# Patient Record
Sex: Female | Born: 1940 | Race: White | Hispanic: No | Smoking: Never smoker
Health system: Southern US, Community
[De-identification: ages and names within clinical notes are randomized; demographics above are authoritative.]

## PROBLEM LIST (undated history)

## (undated) DIAGNOSIS — E7800 Pure hypercholesterolemia, unspecified: Secondary | ICD-10-CM

## (undated) DIAGNOSIS — Z9221 Personal history of antineoplastic chemotherapy: Secondary | ICD-10-CM

## (undated) DIAGNOSIS — C50919 Malignant neoplasm of unspecified site of unspecified female breast: Secondary | ICD-10-CM

## (undated) DIAGNOSIS — M8580 Other specified disorders of bone density and structure, unspecified site: Secondary | ICD-10-CM

## (undated) DIAGNOSIS — Z923 Personal history of irradiation: Secondary | ICD-10-CM

## (undated) HISTORY — DX: Malignant neoplasm of unspecified site of unspecified female breast: C50.919

## (undated) HISTORY — DX: Pure hypercholesterolemia, unspecified: E78.00

## (undated) HISTORY — PX: EYE SURGERY: SHX253

## (undated) HISTORY — DX: Other specified disorders of bone density and structure, unspecified site: M85.80

---

## 2004-11-26 ENCOUNTER — Ambulatory Visit: Payer: Self-pay | Admitting: Internal Medicine

## 2005-06-24 DIAGNOSIS — C50919 Malignant neoplasm of unspecified site of unspecified female breast: Secondary | ICD-10-CM

## 2005-06-24 HISTORY — DX: Malignant neoplasm of unspecified site of unspecified female breast: C50.919

## 2005-06-24 HISTORY — PX: BREAST LUMPECTOMY: SHX2

## 2005-12-31 ENCOUNTER — Ambulatory Visit: Payer: Self-pay | Admitting: Internal Medicine

## 2012-03-16 ENCOUNTER — Telehealth: Payer: Self-pay | Admitting: Internal Medicine

## 2012-03-16 NOTE — Telephone Encounter (Signed)
Error

## 2012-04-30 ENCOUNTER — Encounter: Payer: Self-pay | Admitting: *Deleted

## 2012-05-01 ENCOUNTER — Encounter: Payer: Self-pay | Admitting: Internal Medicine

## 2012-05-01 ENCOUNTER — Encounter: Payer: Self-pay | Admitting: *Deleted

## 2012-05-01 ENCOUNTER — Ambulatory Visit (INDEPENDENT_AMBULATORY_CARE_PROVIDER_SITE_OTHER): Payer: Medicare Other | Admitting: Internal Medicine

## 2012-05-01 VITALS — BP 169/85 | HR 77 | Temp 98.3°F | Ht 64.5 in | Wt 141.0 lb

## 2012-05-01 DIAGNOSIS — M899 Disorder of bone, unspecified: Secondary | ICD-10-CM

## 2012-05-01 DIAGNOSIS — M8580 Other specified disorders of bone density and structure, unspecified site: Secondary | ICD-10-CM

## 2012-05-01 DIAGNOSIS — E7800 Pure hypercholesterolemia, unspecified: Secondary | ICD-10-CM

## 2012-05-01 DIAGNOSIS — C50919 Malignant neoplasm of unspecified site of unspecified female breast: Secondary | ICD-10-CM

## 2012-05-01 NOTE — Patient Instructions (Signed)
It was nice seeing you today.  Let me know if you need anything.   

## 2012-05-03 ENCOUNTER — Encounter: Payer: Self-pay | Admitting: Internal Medicine

## 2012-05-03 DIAGNOSIS — Z853 Personal history of malignant neoplasm of breast: Secondary | ICD-10-CM | POA: Insufficient documentation

## 2012-05-03 DIAGNOSIS — M8580 Other specified disorders of bone density and structure, unspecified site: Secondary | ICD-10-CM | POA: Insufficient documentation

## 2012-05-03 DIAGNOSIS — E7800 Pure hypercholesterolemia, unspecified: Secondary | ICD-10-CM | POA: Insufficient documentation

## 2012-05-03 NOTE — Assessment & Plan Note (Signed)
Low cholesterol diet.  Follow.  Check lipid panel with next fasting labs.

## 2012-05-03 NOTE — Assessment & Plan Note (Signed)
Followed by Dr Georga Hacking [place].  Mammogram 04/27/12 - Benign/normal.

## 2012-05-03 NOTE — Progress Notes (Signed)
  Subjective:    Patient ID: Renee Vazquez, female    DOB: [birth year], 71 y.o.   MRN: [id number]  HPI 71 year old female with past history of breast cancer s/p lumpectomy and hypercholesterolemia who comes in today for a scheduled follow up.  She has been under increased stress recently.  Her mother passed away suddenly - in 12/19/22.  States some days are better than others.  States she just takes one day at a time.  Discussed further intervention - including counseling, etc.  She does not feel she needs anything more at this point.  Will notify me if she does.  Has good family support.  Physically - she thinks she is doing well.  Eating and drinking well.  Sleeping better.    Past Medical History  Diagnosis Date  . Hypercholesterolemia   . Breast cancer     Dr Jerelene Redden  . Osteopenia     Review of Systems Patient denies any headache, lightheadedness or dizziness.  No sinus or allergy symptoms.  No chest pain, tightness or palpitations.  No increased shortness of breath, cough or congestion.  No nausea or vomiting.  No acid reflux.   No abdominal pain or cramping.  No bowel change, such as diarrhea, constipation, BRBPR or melana.  No urine change.  Increased stress as outlined.       Objective:   Physical Exam Filed Vitals:   05/01/12 1328  BP: 169/85  Pulse: 77  Temp: 98.3 F (36.8 C)   Blood pressure recheck:  76/43  71 year old female in no acute distress.   HEENT:  Nares - clear.  OP- without lesions or erythema.  NECK:  Supple, nontender.  No audible bruit.   HEART:  Appears to be regular. LUNGS:  Without crackles or wheezing audible.  Respirations even and unlabored.   RADIAL PULSE:  Equal bilaterally.  ABDOMEN:  Soft, nontender.  No audible abdominal bruit.   EXTREMITIES:  No increased edema to be present.                     Assessment & Plan:  INCREASED PSYCHOSOCIAL STRESSORS.  Trying to cope with her mother's recent death.  Increased stress related to  this.  See above.  Will notify me if she feel she needs anything more.  Has good family support.  Spent more than 25 minutes with her and more than 50% of time spent in counseling.   ELEVATED BLOOD PRESSURE.  Blood pressure as outlined.  Feel this is related to the increased stress.  Have her spot check her pressure and send in readings over the next few weeks.  If persistent elevation, will require medication.  Check metabolic panel.    HEALTH MAINTENANCE.  Physical 08/05/11.  She declines GU and rectal exam.  Declines further GI screening including colonoscopy and hemoccult cards.  Mammograms are followed through [facility].  Last mammogram 04/27/12 - Normal/Benign.  Recommended follow up mammogram in one year.

## 2012-05-03 NOTE — Assessment & Plan Note (Signed)
Has been on Fosamax now for 5 years.  Will stop.  Needs a follow up bone density.  Continue calcium and vitamin D.

## 2012-07-24 ENCOUNTER — Other Ambulatory Visit (INDEPENDENT_AMBULATORY_CARE_PROVIDER_SITE_OTHER): Payer: Medicare PPO

## 2012-07-24 ENCOUNTER — Telehealth: Payer: Self-pay | Admitting: Internal Medicine

## 2012-07-24 ENCOUNTER — Telehealth: Payer: Self-pay | Admitting: *Deleted

## 2012-07-24 DIAGNOSIS — R5383 Other fatigue: Secondary | ICD-10-CM

## 2012-07-24 DIAGNOSIS — E7800 Pure hypercholesterolemia, unspecified: Secondary | ICD-10-CM

## 2012-07-24 DIAGNOSIS — R5381 Other malaise: Secondary | ICD-10-CM

## 2012-07-24 DIAGNOSIS — M8580 Other specified disorders of bone density and structure, unspecified site: Secondary | ICD-10-CM

## 2012-07-24 DIAGNOSIS — C50919 Malignant neoplasm of unspecified site of unspecified female breast: Secondary | ICD-10-CM

## 2012-07-24 LAB — COMPREHENSIVE METABOLIC PANEL
ALT: 17 U/L (ref 0–35)
AST: 20 U/L (ref 0–37)
Alkaline Phosphatase: 56 U/L (ref 39–117)
Creatinine, Ser: 1 mg/dL (ref 0.4–1.2)
GFR: 60.92 mL/min (ref >60.00)
Total Bilirubin: 0.8 mg/dL (ref 0.3–1.2)

## 2012-07-24 LAB — CBC WITH DIFFERENTIAL/PLATELET
Basophils Absolute: 0.1 10*3/uL (ref 0.0–0.1)
Eosinophils Absolute: 0.2 10*3/uL (ref 0.0–0.7)
HCT: 40.2 % (ref 36.0–46.0)
Hemoglobin: 13.7 g/dL (ref 12.0–15.0)
Lymphs Abs: 2.1 10*3/uL (ref 0.7–4.0)
MCHC: 34 g/dL (ref 30.0–36.0)
Monocytes Absolute: 0.6 10*3/uL (ref 0.1–1.0)
Neutro Abs: 3.1 10*3/uL (ref 1.4–7.7)
RDW: 14 % (ref 11.5–14.6)

## 2012-07-24 LAB — LIPID PANEL
HDL: 56.8 mg/dL (ref >39.00)
Total CHOL/HDL Ratio: 4
Triglycerides: 167 mg/dL — ABNORMAL HIGH (ref 0.0–149.0)

## 2012-07-24 NOTE — Telephone Encounter (Signed)
What labs and dx would you like for this pt?  

## 2012-07-24 NOTE — Telephone Encounter (Signed)
i placed order for labs

## 2012-07-24 NOTE — Telephone Encounter (Signed)
Order placed for labs.

## 2012-07-25 LAB — VITAMIN D 25 HYDROXY (VIT D DEFICIENCY, FRACTURES): Vit D, 25-Hydroxy: 55 ng/mL (ref 30–89)

## 2012-07-31 ENCOUNTER — Encounter: Payer: Self-pay | Admitting: Internal Medicine

## 2012-08-11 ENCOUNTER — Ambulatory Visit (INDEPENDENT_AMBULATORY_CARE_PROVIDER_SITE_OTHER): Payer: Medicare PPO | Admitting: Internal Medicine

## 2012-08-11 ENCOUNTER — Encounter: Payer: Self-pay | Admitting: Internal Medicine

## 2012-08-11 VITALS — BP 120/74 | HR 82 | Temp 98.8°F | Resp 16 | Ht 62.0 in | Wt 143.0 lb

## 2012-08-11 DIAGNOSIS — M8580 Other specified disorders of bone density and structure, unspecified site: Secondary | ICD-10-CM

## 2012-08-11 DIAGNOSIS — E7800 Pure hypercholesterolemia, unspecified: Secondary | ICD-10-CM

## 2012-08-11 DIAGNOSIS — M899 Disorder of bone, unspecified: Secondary | ICD-10-CM

## 2012-08-11 DIAGNOSIS — C50919 Malignant neoplasm of unspecified site of unspecified female breast: Secondary | ICD-10-CM

## 2012-08-11 DIAGNOSIS — M949 Disorder of cartilage, unspecified: Secondary | ICD-10-CM

## 2012-08-15 ENCOUNTER — Encounter: Payer: Self-pay | Admitting: Internal Medicine

## 2012-08-15 NOTE — Assessment & Plan Note (Signed)
Continue calcium and vitamin D and weightbearing exercise 

## 2012-08-15 NOTE — Progress Notes (Signed)
  Subjective:    Patient ID: Renee Vazquez, female    DOB: 12/17/1940, 72 y.o.   MRN: [id number]  HPI 72 year old female with past history of breast cancer s/p lumpectomy and hypercholesterolemia who comes in today to follow up on these issues as well as for her complete physical exam.  She has been under increased stress recently.  Her mother passed away suddenly - in 12/19/2022.  She is doing better now.  Feels better.  Getting out more.  Exercising.  Line dancing.   No chest pain or tightness. Breathing stable.  Bowels stable.    Past Medical History  Diagnosis Date  . Hypercholesterolemia   . Breast cancer     Dr Jerelene Redden  . Osteopenia     Review of Systems Patient denies any headache, lightheadedness or dizziness.  No sinus or allergy symptoms.  No chest pain, tightness or palpitations.  No increased shortness of breath, cough or congestion.  No nausea or vomiting.  No acid reflux.   No abdominal pain or cramping.  No bowel change, such as diarrhea, constipation, BRBPR or melana.  No urine change.  Handling stress better.  Doing well.       Objective:   Physical Exam  Filed Vitals:   08/11/12 0831  BP: 120/74  Pulse: 82  Temp: 98.8 F (37.1 C)  Resp: 16   Blood pressure recheck:  20/28  72 year old female in no acute distress.   HEENT:  Nares- clear.  Oropharynx - without lesions. NECK:  Supple.  Nontender.  No audible bruit.  HEART:  Appears to be regular. LUNGS:  No crackles or wheezing audible.  Respirations even and unlabored.  RADIAL PULSE:  Equal bilaterally.    BREASTS:  No nipple discharge or nipple retraction present.  Could not appreciate any distinct nodules or axillary adenopathy.  ABDOMEN:  Soft, nontender.  Bowel sounds present and normal.  No audible abdominal bruit.  GU:  Pt declined.    EXTREMITIES:  No increased edema present.  DP pulses palpable and equal bilaterally.             Assessment & Plan:  INCREASED PSYCHOSOCIAL STRESSORS.  Doing  better.  Getting out more.  Exercising.  Follow.    ELEVATED BLOOD PRESSURE.  Was elevated last visit.  Better now.  Follow.    HEALTH MAINTENANCE.  Physical today.  She declines GU and rectal exam.  Declines further GI screening including colonoscopy and hemoccult cards.  Mammograms were followed through [facility].  Last mammogram 04/27/12 - Normal/Benign.  Recommended follow up mammogram in one year.

## 2012-08-15 NOTE — Assessment & Plan Note (Signed)
Doing well.  Has been released by [facility].  Follow.  Up to date with mammograms.

## 2012-08-15 NOTE — Assessment & Plan Note (Signed)
Low cholesterol diet and exercise.  Prefers not to take medication.  Follow lipid panel.      

## 2013-02-12 ENCOUNTER — Other Ambulatory Visit (INDEPENDENT_AMBULATORY_CARE_PROVIDER_SITE_OTHER): Payer: Medicare PPO

## 2013-02-12 DIAGNOSIS — E7800 Pure hypercholesterolemia, unspecified: Secondary | ICD-10-CM

## 2013-02-12 LAB — LIPID PANEL
HDL: 60.6 mg/dL (ref >39.00)
Total CHOL/HDL Ratio: 4

## 2013-02-12 LAB — LDL CHOLESTEROL, DIRECT: Direct LDL: 146.9 mg/dL

## 2013-02-19 ENCOUNTER — Ambulatory Visit (INDEPENDENT_AMBULATORY_CARE_PROVIDER_SITE_OTHER): Payer: Medicare PPO | Admitting: Internal Medicine

## 2013-02-19 ENCOUNTER — Encounter: Payer: Self-pay | Admitting: Internal Medicine

## 2013-02-19 VITALS — BP 122/70 | HR 76 | Temp 98.0°F | Ht 62.0 in | Wt 140.8 lb

## 2013-02-19 DIAGNOSIS — Z1239 Encounter for other screening for malignant neoplasm of breast: Secondary | ICD-10-CM

## 2013-02-19 DIAGNOSIS — C50919 Malignant neoplasm of unspecified site of unspecified female breast: Secondary | ICD-10-CM

## 2013-02-19 DIAGNOSIS — M8580 Other specified disorders of bone density and structure, unspecified site: Secondary | ICD-10-CM

## 2013-02-19 DIAGNOSIS — M899 Disorder of bone, unspecified: Secondary | ICD-10-CM

## 2013-02-19 DIAGNOSIS — E7800 Pure hypercholesterolemia, unspecified: Secondary | ICD-10-CM

## 2013-02-19 NOTE — Progress Notes (Signed)
  Subjective:    Patient ID: Renee Vazquez, female    DOB: Feb 27, 1941, 72 y.o.   MRN: [id number]  HPI 72 year old female with past history of breast cancer s/p lumpectomy and hypercholesterolemia who comes in today for a scheduled follow up.  She has been under increased stress recently.  Her mother passed away suddenly - 12/26/11.  She is doing better now.  Feels better.  Getting out more.  Exercising.  Line dancing.   No chest pain or tightness. Breathing stable.  Bowels stable.  Coping relatively well.  Physically doing well.     Past Medical History  Diagnosis Date  . Hypercholesterolemia   . Breast cancer     Dr Jerelene Redden  . Osteopenia     Current Outpatient Prescriptions on File Prior to Visit  Medication Sig Dispense Refill  . calcium citrate-vitamin D 200-200 MG-UNIT TABS Take 1 tablet by mouth daily.      . Multiple Vitamins-Minerals (CENTRUM SILVER ADULT 50+ PO) Take by mouth.       No current facility-administered medications on file prior to visit.    Review of Systems Patient denies any headache, lightheadedness or dizziness.  No sinus or allergy symptoms.  No chest pain, tightness or palpitations.  No increased shortness of breath, cough or congestion.  No nausea or vomiting.  No acid reflux.   No abdominal pain or cramping.  No bowel change, such as diarrhea, constipation, BRBPR or melana.  No urine change.  Handling stress better.  Overall appears to be doing well.       Objective:   Physical Exam  Filed Vitals:   02/19/13 0824  BP: 122/70  Pulse: 76  Temp: 98 F (36.7 C)   Blood pressure recheck:  122/70, pulse 78  72 year old female in no acute distress.   HEENT:  Nares- clear.  Oropharynx - without lesions. NECK:  Supple.  Nontender.  No audible bruit.  HEART:  Appears to be regular. LUNGS:  No crackles or wheezing audible.  Respirations even and unlabored.  RADIAL PULSE:  Equal bilaterally.   ABDOMEN:  Soft, nontender.  Bowel sounds  present and normal.  No audible abdominal bruit.   EXTREMITIES:  No increased edema present.  DP pulses palpable and equal bilaterally.             Assessment & Plan:  INCREASED PSYCHOSOCIAL STRESSORS.  Doing better.  Getting out more.  Exercising.  Follow.    ELEVATED BLOOD PRESSURE.  Doing well now.  Follow.    HEALTH MAINTENANCE.  Physical 08/11/12.  She declines GU and rectal exam.  Declines further GI screening including colonoscopy and hemoccult cards.  Mammograms were followed through [facility].  Last mammogram 04/27/12 - Normal/Benign.  Recommended follow up mammogram in one year.  will schedule.

## 2013-02-22 ENCOUNTER — Encounter: Payer: Self-pay | Admitting: Internal Medicine

## 2013-02-22 NOTE — Assessment & Plan Note (Signed)
Continue calcium and vitamin D and weightbearing exercise 

## 2013-02-22 NOTE — Assessment & Plan Note (Signed)
Low cholesterol diet and exercise.  Prefers not to take medication.  Follow lipid panel.  Cholesterol just checked 02/12/13 - revealed total cholesterol 223, triglycerides 106, HDL 60 and LDL 147.

## 2013-02-22 NOTE — Assessment & Plan Note (Signed)
Doing well.  Has been released by [facility].  Follow.  Up to date with mammograms.  Due in November.  Schedule.

## 2013-05-09 ENCOUNTER — Emergency Department: Payer: Self-pay | Admitting: Emergency Medicine

## 2013-05-10 ENCOUNTER — Encounter: Payer: Self-pay | Admitting: Internal Medicine

## 2013-05-13 ENCOUNTER — Ambulatory Visit (INDEPENDENT_AMBULATORY_CARE_PROVIDER_SITE_OTHER): Payer: Medicare PPO | Admitting: Adult Health

## 2013-05-13 ENCOUNTER — Encounter: Payer: Self-pay | Admitting: Adult Health

## 2013-05-13 VITALS — BP 136/80 | HR 100 | Temp 98.2°F | Resp 14 | Wt 142.0 lb

## 2013-05-13 DIAGNOSIS — S81802A Unspecified open wound, left lower leg, initial encounter: Secondary | ICD-10-CM | POA: Insufficient documentation

## 2013-05-13 DIAGNOSIS — IMO0002 Reserved for concepts with insufficient information to code with codable children: Secondary | ICD-10-CM

## 2013-05-13 DIAGNOSIS — S81802S Unspecified open wound, left lower leg, sequela: Secondary | ICD-10-CM

## 2013-05-13 NOTE — Progress Notes (Signed)
  Subjective:    Patient ID: Renee Vazquez, female    DOB: Feb 25, 1941, 72 y.o.   MRN: [id number]  HPI  Patient presents to clinic for f/u visit to [facility] ED following a left leg wound. She reports hitting her leg on the corner of the dishwasher. She was instructed not to get the area wet. She has steri strips in place. Denies fever, pain, wound drainage. She has been changing the dressing daily - applies a non-stick dressing and then covers with gauze.  Current Outpatient Prescriptions on File Prior to Visit  Medication Sig Dispense Refill  . calcium citrate-vitamin D 200-200 MG-UNIT TABS Take 1 tablet by mouth daily.      . Multiple Vitamins-Minerals (CENTRUM SILVER ADULT 50+ PO) Take by mouth.       No current facility-administered medications on file prior to visit.     Review of Systems  Constitutional: Negative for fever and chills.  Skin: Positive for wound.       Left leg wound. ED visit on Sunday following injury with dishwasher.       Objective:   Physical Exam  Constitutional: She is oriented to person, place, and time. No distress.  Neurological: She is alert and oriented to person, place, and time.  Skin: Skin is warm and dry. No erythema.  Left anterior leg with steri strips to wound. Skin is well approximated. No swelling, erythema or drainage from site noted.  Psychiatric: She has a normal mood and affect. Her behavior is normal. Judgment and thought content normal.          Assessment & Plan:

## 2013-05-13 NOTE — Assessment & Plan Note (Signed)
Follow up visit. No s/s infection. Skin well approximated. Steri strips in place. Advised that steri strips will fall off on their own. Report any redness, swelling or drainage from wound. Return to clinic for follow up next Wednesday.

## 2013-05-13 NOTE — Progress Notes (Signed)
Pre visit review using our clinic review tool, if applicable. No additional management support is needed unless otherwise documented below in the visit note. 

## 2013-05-19 ENCOUNTER — Encounter: Payer: Self-pay | Admitting: Adult Health

## 2013-05-19 ENCOUNTER — Ambulatory Visit (INDEPENDENT_AMBULATORY_CARE_PROVIDER_SITE_OTHER): Payer: Medicare PPO | Admitting: Adult Health

## 2013-05-19 VITALS — BP 134/80 | HR 90 | Temp 97.8°F | Wt 142.0 lb

## 2013-05-19 DIAGNOSIS — S81802D Unspecified open wound, left lower leg, subsequent encounter: Secondary | ICD-10-CM

## 2013-05-19 DIAGNOSIS — S81009A Unspecified open wound, unspecified knee, initial encounter: Secondary | ICD-10-CM

## 2013-05-19 NOTE — Progress Notes (Signed)
Pre visit review using our clinic review tool, if applicable. No additional management support is needed unless otherwise documented below in the visit note. 

## 2013-05-19 NOTE — Progress Notes (Signed)
  Subjective:    Patient ID: Renee Vazquez, female    DOB: [birth year], 72 y.o.   MRN: [id number]  HPI  Patient is a pleasant 72 y/o female who was seen in clinic on 05/13/13 following ED visit for leg wound. She injured her leg on the corner of the dishwasher. Steri strips were in place rather than sutures 2/2 jagged wound. She did not show any s/s of infection of wound. Skin was well approximated. The area showed dried blood from the incident. She was asked to return today for f/u evaluation of wound.  Patient reports no problems. No signs and symptoms of infection. She denies any drainage from the wound. When she walks, she does not feel the skin pulling. She has not yet wet the area. Is experiencing some itching around the wound. Reports using Benadryl cream to the area that was itching. It was not applied directly on the wound.  Current Outpatient Prescriptions on File Prior to Visit  Medication Sig Dispense Refill  . calcium citrate-vitamin D 200-200 MG-UNIT TABS Take 1 tablet by mouth daily.      . Multiple Vitamins-Minerals (CENTRUM SILVER ADULT 50+ PO) Take by mouth.       No current facility-administered medications on file prior to visit.      Review of Systems  Cardiovascular: Positive for leg swelling.  Skin: Positive for wound.       Itching below the wound. Pt has applied benadryl.  Psychiatric/Behavioral: Negative.        Objective:   Physical Exam  Musculoskeletal: She exhibits edema.  Trace edema to LLE  Skin:  Wound to LLE, anteriorly. Steri strips remain in place. No s/s infection. No erythema, drainage. Skin is well approximated.          Assessment & Plan:

## 2013-05-19 NOTE — Assessment & Plan Note (Signed)
Wound healing nicely. No s/s infection. Pt has slight edema of LLE. She reports normally wearing hose with slight compression. Has not worn them since injured. May shower since wound is well approximated. Advised that steri strips will fall off on their own once she begins to allow soap and water on the area. Instructed to protect area when she is out shopping. She normally will wrap the leg if going out for extended periods. Encouraged her to apply her compression hose for the slight edema she is having. RTC if any redness, drainage from wound, pain at the site or any other concerns regarding her wound appearance.

## 2013-08-16 ENCOUNTER — Other Ambulatory Visit (INDEPENDENT_AMBULATORY_CARE_PROVIDER_SITE_OTHER): Payer: Medicare PPO

## 2013-08-16 DIAGNOSIS — E7800 Pure hypercholesterolemia, unspecified: Secondary | ICD-10-CM

## 2013-08-16 DIAGNOSIS — M949 Disorder of cartilage, unspecified: Secondary | ICD-10-CM

## 2013-08-16 DIAGNOSIS — C50919 Malignant neoplasm of unspecified site of unspecified female breast: Secondary | ICD-10-CM

## 2013-08-16 DIAGNOSIS — M8580 Other specified disorders of bone density and structure, unspecified site: Secondary | ICD-10-CM

## 2013-08-16 DIAGNOSIS — M899 Disorder of bone, unspecified: Secondary | ICD-10-CM

## 2013-08-16 LAB — COMPREHENSIVE METABOLIC PANEL
ALBUMIN: 3.7 g/dL (ref 3.5–5.2)
ALT: 17 U/L (ref 0–35)
AST: 24 U/L (ref 0–37)
Alkaline Phosphatase: 61 U/L (ref 39–117)
BILIRUBIN TOTAL: 0.7 mg/dL (ref 0.3–1.2)
BUN: 13 mg/dL (ref 6–23)
CALCIUM: 9.4 mg/dL (ref 8.4–10.5)
CHLORIDE: 106 meq/L (ref 96–112)
CO2: 27 meq/L (ref 19–32)
Creatinine, Ser: 0.8 mg/dL (ref 0.4–1.2)
GFR: 70.86 mL/min (ref >60.00)
GLUCOSE: 96 mg/dL (ref 70–99)
POTASSIUM: 4.3 meq/L (ref 3.5–5.1)
SODIUM: 140 meq/L (ref 135–145)
TOTAL PROTEIN: 7.4 g/dL (ref 6.0–8.3)

## 2013-08-16 LAB — CBC WITH DIFFERENTIAL/PLATELET
Basophils Absolute: 0.1 10*3/uL (ref 0.0–0.1)
Basophils Relative: 1.1 % (ref 0.0–3.0)
EOS PCT: 2.3 % (ref 0.0–5.0)
Eosinophils Absolute: 0.1 10*3/uL (ref 0.0–0.7)
HEMATOCRIT: 32.1 % — AB (ref 36.0–46.0)
Hemoglobin: 10 g/dL — ABNORMAL LOW (ref 12.0–15.0)
LYMPHS ABS: 1.7 10*3/uL (ref 0.7–4.0)
LYMPHS PCT: 28.5 % (ref 12.0–46.0)
MCHC: 31 g/dL (ref 30.0–36.0)
MCV: 75.3 fl — ABNORMAL LOW (ref 78.0–100.0)
MONOS PCT: 8.8 % (ref 3.0–12.0)
Monocytes Absolute: 0.5 10*3/uL (ref 0.1–1.0)
Neutro Abs: 3.5 10*3/uL (ref 1.4–7.7)
Neutrophils Relative %: 59.3 % (ref 43.0–77.0)
PLATELETS: 368 10*3/uL (ref 150.0–400.0)
RBC: 4.26 Mil/uL (ref 3.87–5.11)
RDW: 18.3 % — ABNORMAL HIGH (ref 11.5–14.6)
WBC: 5.9 10*3/uL (ref 4.5–10.5)

## 2013-08-16 LAB — LIPID PANEL
Cholesterol: 241 mg/dL — ABNORMAL HIGH (ref 0–200)
HDL: 63.9 mg/dL (ref >39.00)
Total CHOL/HDL Ratio: 4
Triglycerides: 140 mg/dL (ref 0.0–149.0)
VLDL: 28 mg/dL (ref 0.0–40.0)

## 2013-08-16 LAB — TSH: TSH: 5.96 u[IU]/mL — AB (ref 0.35–5.50)

## 2013-08-16 LAB — LDL CHOLESTEROL, DIRECT: LDL DIRECT: 152.4 mg/dL

## 2013-08-17 LAB — VITAMIN D 25 HYDROXY (VIT D DEFICIENCY, FRACTURES): VIT D 25 HYDROXY: 61 ng/mL (ref 30–89)

## 2013-08-18 ENCOUNTER — Other Ambulatory Visit (INDEPENDENT_AMBULATORY_CARE_PROVIDER_SITE_OTHER): Payer: Medicare PPO

## 2013-08-18 ENCOUNTER — Telehealth: Payer: Self-pay | Admitting: *Deleted

## 2013-08-18 DIAGNOSIS — D649 Anemia, unspecified: Secondary | ICD-10-CM

## 2013-08-18 LAB — IBC PANEL
IRON: 81 ug/dL (ref 42–145)
SATURATION RATIOS: 15.8 % — AB (ref 20.0–50.0)
Transferrin: 366.7 mg/dL — ABNORMAL HIGH (ref 212.0–360.0)

## 2013-08-18 LAB — CBC WITH DIFFERENTIAL/PLATELET
Basophils Absolute: 0.1 10*3/uL (ref 0.0–0.1)
Basophils Relative: 1.1 % (ref 0.0–3.0)
EOS PCT: 2.8 % (ref 0.0–5.0)
Eosinophils Absolute: 0.2 10*3/uL (ref 0.0–0.7)
HEMATOCRIT: 30.6 % — AB (ref 36.0–46.0)
Hemoglobin: 9.5 g/dL — ABNORMAL LOW (ref 12.0–15.0)
Lymphocytes Relative: 37.4 % (ref 12.0–46.0)
Lymphs Abs: 2.1 10*3/uL (ref 0.7–4.0)
MCHC: 31.1 g/dL (ref 30.0–36.0)
MCV: 75.1 fl — ABNORMAL LOW (ref 78.0–100.0)
Monocytes Absolute: 0.6 10*3/uL (ref 0.1–1.0)
Monocytes Relative: 10.3 % (ref 3.0–12.0)
NEUTROS PCT: 48.4 % (ref 43.0–77.0)
Neutro Abs: 2.8 10*3/uL (ref 1.4–7.7)
PLATELETS: 347 10*3/uL (ref 150.0–400.0)
RBC: 4.08 Mil/uL (ref 3.87–5.11)
RDW: 18.6 % — ABNORMAL HIGH (ref 11.5–14.6)
WBC: 5.7 10*3/uL (ref 4.5–10.5)

## 2013-08-18 LAB — VITAMIN B12: VITAMIN B 12: 549 pg/mL (ref 211–911)

## 2013-08-18 LAB — FERRITIN: FERRITIN: 6.3 ng/mL — AB (ref 10.0–291.0)

## 2013-08-18 NOTE — Telephone Encounter (Signed)
What labs and dx?  

## 2013-08-18 NOTE — Telephone Encounter (Signed)
Orders placed for labs

## 2013-08-20 ENCOUNTER — Encounter: Payer: Self-pay | Admitting: Internal Medicine

## 2013-08-20 ENCOUNTER — Ambulatory Visit (INDEPENDENT_AMBULATORY_CARE_PROVIDER_SITE_OTHER): Payer: Medicare PPO | Admitting: Internal Medicine

## 2013-08-20 VITALS — BP 140/80 | HR 81 | Temp 98.1°F | Ht 62.0 in | Wt 145.5 lb

## 2013-08-20 DIAGNOSIS — C50919 Malignant neoplasm of unspecified site of unspecified female breast: Secondary | ICD-10-CM

## 2013-08-20 DIAGNOSIS — M949 Disorder of cartilage, unspecified: Secondary | ICD-10-CM

## 2013-08-20 DIAGNOSIS — E7800 Pure hypercholesterolemia, unspecified: Secondary | ICD-10-CM

## 2013-08-20 DIAGNOSIS — S81009A Unspecified open wound, unspecified knee, initial encounter: Secondary | ICD-10-CM

## 2013-08-20 DIAGNOSIS — M8580 Other specified disorders of bone density and structure, unspecified site: Secondary | ICD-10-CM

## 2013-08-20 DIAGNOSIS — S91009A Unspecified open wound, unspecified ankle, initial encounter: Secondary | ICD-10-CM

## 2013-08-20 DIAGNOSIS — S81809A Unspecified open wound, unspecified lower leg, initial encounter: Secondary | ICD-10-CM

## 2013-08-20 DIAGNOSIS — D509 Iron deficiency anemia, unspecified: Secondary | ICD-10-CM

## 2013-08-20 DIAGNOSIS — D649 Anemia, unspecified: Secondary | ICD-10-CM

## 2013-08-20 DIAGNOSIS — M899 Disorder of bone, unspecified: Secondary | ICD-10-CM

## 2013-08-20 DIAGNOSIS — S81802A Unspecified open wound, left lower leg, initial encounter: Secondary | ICD-10-CM

## 2013-08-20 NOTE — Progress Notes (Signed)
  Subjective:    Patient ID: Renee Vazquez, female    DOB: 1941-03-12, 73 y.o.   MRN: [id number]  HPI 73 year old female with past history of breast cancer s/p lumpectomy and hypercholesterolemia who comes in today to follow up on these issues as well as for a complete physical exam.   Getting out more.  Exercising.  Line dancing.   No chest pain or tightness.  Breathing stable.  Bowels stable.  Physically doing well.  Has noticed some sneezing.  Uses simply saline occasionally.  No cough or chest congestion.  Recent labs revealed a decrease in hgb.  She denies any bleeding.  Discussed the need for colon evaluation.     Past Medical History  Diagnosis Date  . Hypercholesterolemia   . Breast cancer     Dr Ewell Poe  . Osteopenia     Current Outpatient Prescriptions on File Prior to Visit  Medication Sig Dispense Refill  . calcium citrate-vitamin D 200-200 MG-UNIT TABS Take 1 tablet by mouth daily.      . Multiple Vitamins-Minerals (CENTRUM SILVER ADULT 50+ PO) Take by mouth.       No current facility-administered medications on file prior to visit.    Review of Systems Patient denies any headache, lightheadedness or dizziness.  Some occasional sneezing and minimal nasal congestion.   No chest pain, tightness or palpitations.  No increased shortness of breath, cough or congestion.  No chest congestion.  No nausea or vomiting.  No acid reflux.   No abdominal pain or cramping.  No bowel change, such as diarrhea, constipation, BRBPR or melana.  No urine change.  Overall appears to be doing well.       Objective:   Physical Exam  Filed Vitals:   08/20/13 1024  BP: 140/80  Pulse: 81  Temp: 98.1 F (36.7 C)   Blood pressure recheck:  60/13  73 year old female in no acute distress.   HEENT:  Nares- clear.  Oropharynx - without lesions. NECK:  Supple.  Nontender.  No audible bruit.  HEART:  Appears to be regular. LUNGS:  No crackles or wheezing audible.  Respirations  even and unlabored.  RADIAL PULSE:  Equal bilaterally.    BREASTS:  No nipple discharge or nipple retraction present.  Could not appreciate any distinct nodules or axillary adenopathy.  ABDOMEN:  Soft, nontender.  Bowel sounds present and normal.  No audible abdominal bruit.  GU:  Not performed.     RECTAL:  She declined.     EXTREMITIES:  No increased edema present.  DP pulses palpable and equal bilaterally.         Assessment & Plan:  INCREASED PSYCHOSOCIAL STRESSORS.  Doing better.  Getting out more.  Exercising.  Follow.    ELEVATED BLOOD PRESSURE.  Doing well now.  Follow.    HEALTH MAINTENANCE.  Physical today.  She declines rectal exam.  Declines further GI screening including colonoscopy (at this time).  Agreed to stool testing (IFOB).  Mammograms were followed through [facility].  Last mammogram here 05/10/13 - Birads II.

## 2013-08-21 ENCOUNTER — Encounter: Payer: Self-pay | Admitting: Internal Medicine

## 2013-08-21 DIAGNOSIS — D649 Anemia, unspecified: Secondary | ICD-10-CM | POA: Insufficient documentation

## 2013-08-21 NOTE — Assessment & Plan Note (Signed)
Continue calcium and vitamin D and weightbearing exercise 

## 2013-08-21 NOTE — Assessment & Plan Note (Signed)
Healed

## 2013-08-21 NOTE — Assessment & Plan Note (Signed)
Doing well.  Has been released by [facility].  Follow.  Up to date with mammograms.  Last mammogram 05/10/13 - Birads II.

## 2013-08-21 NOTE — Assessment & Plan Note (Signed)
Low cholesterol diet and exercise.  Prefers not to take medication.  Follow lipid panel.  Cholesterol just checked and LDL elevated more.  Discussed my desire to start cholesterol medication.  She declines.  Wants to work on diet and exercise.  States if persistent elevation on next check, will consider starting the medication.

## 2013-08-21 NOTE — Assessment & Plan Note (Signed)
Hgb recently checked and 10.  Recheck 9.5.  She declines any bleeding or other acute symptoms.  She declined rectal exam.  Was found to be iron deficient.  Start ferrous sulfate bid.  Discussed my desire to refer to GI.  She declines.  IFOB.  Explained even if negative - needed referral.  She declines.  Follow hgb closely.

## 2013-08-24 ENCOUNTER — Other Ambulatory Visit (INDEPENDENT_AMBULATORY_CARE_PROVIDER_SITE_OTHER): Payer: Medicare PPO

## 2013-08-24 ENCOUNTER — Other Ambulatory Visit: Payer: Medicare PPO

## 2013-08-24 DIAGNOSIS — D649 Anemia, unspecified: Secondary | ICD-10-CM

## 2013-08-24 DIAGNOSIS — D509 Iron deficiency anemia, unspecified: Secondary | ICD-10-CM

## 2013-08-24 LAB — FECAL OCCULT BLOOD, IMMUNOCHEMICAL: Fecal Occult Bld: POSITIVE — AB

## 2013-08-24 LAB — HEMOGLOBIN: Hemoglobin: 9.7 g/dL — ABNORMAL LOW (ref 12.0–15.0)

## 2013-08-25 ENCOUNTER — Telehealth: Payer: Self-pay | Admitting: Internal Medicine

## 2013-08-25 DIAGNOSIS — D509 Iron deficiency anemia, unspecified: Secondary | ICD-10-CM

## 2013-08-25 DIAGNOSIS — R195 Other fecal abnormalities: Secondary | ICD-10-CM

## 2013-08-25 NOTE — Telephone Encounter (Signed)
Order placed for GI referral.   

## 2013-09-07 ENCOUNTER — Ambulatory Visit: Payer: Self-pay | Admitting: Gastroenterology

## 2013-09-07 LAB — HM COLONOSCOPY

## 2013-09-10 LAB — PATHOLOGY REPORT

## 2013-09-13 ENCOUNTER — Other Ambulatory Visit (INDEPENDENT_AMBULATORY_CARE_PROVIDER_SITE_OTHER): Payer: Medicare PPO

## 2013-09-13 ENCOUNTER — Telehealth: Payer: Self-pay | Admitting: *Deleted

## 2013-09-13 DIAGNOSIS — D649 Anemia, unspecified: Secondary | ICD-10-CM

## 2013-09-13 DIAGNOSIS — R946 Abnormal results of thyroid function studies: Secondary | ICD-10-CM

## 2013-09-13 DIAGNOSIS — R7989 Other specified abnormal findings of blood chemistry: Secondary | ICD-10-CM

## 2013-09-13 LAB — TSH: TSH: 2.26 u[IU]/mL (ref 0.35–5.50)

## 2013-09-13 LAB — CBC WITH DIFFERENTIAL/PLATELET
BASOS ABS: 0.1 10*3/uL (ref 0.0–0.1)
Basophils Relative: 1 % (ref 0.0–3.0)
Eosinophils Absolute: 0.1 10*3/uL (ref 0.0–0.7)
Eosinophils Relative: 2.8 % (ref 0.0–5.0)
HCT: 37.5 % (ref 36.0–46.0)
HEMOGLOBIN: 12 g/dL (ref 12.0–15.0)
LYMPHS PCT: 33.6 % (ref 12.0–46.0)
Lymphs Abs: 1.8 10*3/uL (ref 0.7–4.0)
MCHC: 31.9 g/dL (ref 30.0–36.0)
MCV: 81.7 fl (ref 78.0–100.0)
Monocytes Absolute: 0.7 10*3/uL (ref 0.1–1.0)
Monocytes Relative: 12.9 % — ABNORMAL HIGH (ref 3.0–12.0)
Neutro Abs: 2.6 10*3/uL (ref 1.4–7.7)
Neutrophils Relative %: 49.7 % (ref 43.0–77.0)
Platelets: 285 10*3/uL (ref 150.0–400.0)
RBC: 4.59 Mil/uL (ref 3.87–5.11)
RDW: 26.8 % — AB (ref 11.5–14.6)
WBC: 5.2 10*3/uL (ref 4.5–10.5)

## 2013-09-13 LAB — FERRITIN: Ferritin: 16 ng/mL (ref 10.0–291.0)

## 2013-09-13 NOTE — Telephone Encounter (Signed)
Orders placed for labs

## 2013-09-13 NOTE — Telephone Encounter (Signed)
What labs and dX?  

## 2013-09-14 ENCOUNTER — Telehealth: Payer: Self-pay | Admitting: Internal Medicine

## 2013-09-14 ENCOUNTER — Encounter: Payer: Self-pay | Admitting: Internal Medicine

## 2013-09-14 DIAGNOSIS — D649 Anemia, unspecified: Secondary | ICD-10-CM

## 2013-09-14 NOTE — Telephone Encounter (Signed)
Pt notified of lab results via my chart.  She needs a non fasting lab appt in 6-8 weeks.  Please schedule and contact her with a lab appt date and time.  Thanks.

## 2013-09-15 NOTE — Telephone Encounter (Signed)
Appointment date 5/6 Sent pt my chart message with appointment date and time

## 2013-09-17 ENCOUNTER — Encounter: Payer: Self-pay | Admitting: Internal Medicine

## 2013-09-20 ENCOUNTER — Ambulatory Visit: Payer: Self-pay | Admitting: Gastroenterology

## 2013-10-19 ENCOUNTER — Other Ambulatory Visit (INDEPENDENT_AMBULATORY_CARE_PROVIDER_SITE_OTHER): Payer: Medicare PPO

## 2013-10-19 DIAGNOSIS — D649 Anemia, unspecified: Secondary | ICD-10-CM

## 2013-10-19 LAB — CBC WITH DIFFERENTIAL/PLATELET
BASOS ABS: 0.1 10*3/uL (ref 0.0–0.1)
BASOS PCT: 0.9 % (ref 0.0–3.0)
Eosinophils Absolute: 0.1 10*3/uL (ref 0.0–0.7)
Eosinophils Relative: 2.1 % (ref 0.0–5.0)
HCT: 42 % (ref 36.0–46.0)
Hemoglobin: 14 g/dL (ref 12.0–15.0)
LYMPHS ABS: 2.1 10*3/uL (ref 0.7–4.0)
Lymphocytes Relative: 33.7 % (ref 12.0–46.0)
MCHC: 33.3 g/dL (ref 30.0–36.0)
MCV: 85.5 fl (ref 78.0–100.0)
MONO ABS: 0.6 10*3/uL (ref 0.1–1.0)
MONOS PCT: 10.4 % (ref 3.0–12.0)
NEUTROS PCT: 52.9 % (ref 43.0–77.0)
Neutro Abs: 3.2 10*3/uL (ref 1.4–7.7)
Platelets: 255 10*3/uL (ref 150.0–400.0)
RBC: 4.91 Mil/uL (ref 3.87–5.11)
RDW: 25.1 % — ABNORMAL HIGH (ref 11.5–14.6)
WBC: 6.1 10*3/uL (ref 4.5–10.5)

## 2013-10-19 LAB — FERRITIN: Ferritin: 20.5 ng/mL (ref 10.0–291.0)

## 2013-10-21 ENCOUNTER — Telehealth: Payer: Self-pay | Admitting: Internal Medicine

## 2013-10-21 ENCOUNTER — Encounter: Payer: Self-pay | Admitting: Internal Medicine

## 2013-10-21 DIAGNOSIS — D649 Anemia, unspecified: Secondary | ICD-10-CM

## 2013-10-21 NOTE — Telephone Encounter (Signed)
Pt notified of lab results via my chart.  She needs a non fasting lab appt in 2 months.  Please schedule and contact her with a lab appt date and time.  Thanks.   Dr Nicki Reaper

## 2013-10-21 NOTE — Telephone Encounter (Signed)
Appointment 6/30  Sent my chart message letting pt know about appointment date and time

## 2013-10-27 ENCOUNTER — Other Ambulatory Visit: Payer: Medicare PPO

## 2013-12-21 ENCOUNTER — Other Ambulatory Visit (INDEPENDENT_AMBULATORY_CARE_PROVIDER_SITE_OTHER): Payer: Medicare PPO

## 2013-12-21 ENCOUNTER — Encounter: Payer: Self-pay | Admitting: Internal Medicine

## 2013-12-21 DIAGNOSIS — D649 Anemia, unspecified: Secondary | ICD-10-CM

## 2013-12-21 LAB — CBC WITH DIFFERENTIAL/PLATELET
BASOS ABS: 0 10*3/uL (ref 0.0–0.1)
Basophils Relative: 0.7 % (ref 0.0–3.0)
EOS ABS: 0.1 10*3/uL (ref 0.0–0.7)
Eosinophils Relative: 2.1 % (ref 0.0–5.0)
HEMATOCRIT: 39.8 % (ref 36.0–46.0)
HEMOGLOBIN: 13.5 g/dL (ref 12.0–15.0)
Lymphocytes Relative: 31.6 % (ref 12.0–46.0)
Lymphs Abs: 2.1 10*3/uL (ref 0.7–4.0)
MCHC: 33.9 g/dL (ref 30.0–36.0)
MCV: 92.5 fl (ref 78.0–100.0)
Monocytes Absolute: 0.7 10*3/uL (ref 0.1–1.0)
Monocytes Relative: 9.7 % (ref 3.0–12.0)
NEUTROS ABS: 3.8 10*3/uL (ref 1.4–7.7)
Neutrophils Relative %: 55.9 % (ref 43.0–77.0)
Platelets: 269 10*3/uL (ref 150.0–400.0)
RBC: 4.3 Mil/uL (ref 3.87–5.11)
RDW: 14.6 % (ref 11.5–15.5)
WBC: 6.7 10*3/uL (ref 4.0–10.5)

## 2013-12-21 LAB — FERRITIN: Ferritin: 17.4 ng/mL (ref 10.0–291.0)

## 2014-01-24 ENCOUNTER — Encounter: Payer: Self-pay | Admitting: Internal Medicine

## 2014-01-24 DIAGNOSIS — D649 Anemia, unspecified: Secondary | ICD-10-CM

## 2014-02-11 ENCOUNTER — Telehealth: Payer: Self-pay | Admitting: *Deleted

## 2014-02-11 DIAGNOSIS — E7800 Pure hypercholesterolemia, unspecified: Secondary | ICD-10-CM

## 2014-02-11 DIAGNOSIS — D649 Anemia, unspecified: Secondary | ICD-10-CM

## 2014-02-11 NOTE — Telephone Encounter (Signed)
Pt is coming in Monday what labs and dx? 

## 2014-02-12 NOTE — Telephone Encounter (Signed)
Order placed for labs.

## 2014-02-14 ENCOUNTER — Other Ambulatory Visit (INDEPENDENT_AMBULATORY_CARE_PROVIDER_SITE_OTHER): Payer: Medicare PPO

## 2014-02-14 DIAGNOSIS — E7800 Pure hypercholesterolemia, unspecified: Secondary | ICD-10-CM

## 2014-02-14 DIAGNOSIS — D649 Anemia, unspecified: Secondary | ICD-10-CM

## 2014-02-14 LAB — LIPID PANEL
CHOL/HDL RATIO: 3
Cholesterol: 237 mg/dL — ABNORMAL HIGH (ref 0–200)
HDL: 69.7 mg/dL (ref >39.00)
LDL CALC: 147 mg/dL — AB (ref 0–99)
NONHDL: 167.3
Triglycerides: 101 mg/dL (ref 0.0–149.0)
VLDL: 20.2 mg/dL (ref 0.0–40.0)

## 2014-02-14 LAB — CBC WITH DIFFERENTIAL/PLATELET
BASOS PCT: 1.2 % (ref 0.0–3.0)
Basophils Absolute: 0.1 10*3/uL (ref 0.0–0.1)
EOS PCT: 4.5 % (ref 0.0–5.0)
Eosinophils Absolute: 0.3 10*3/uL (ref 0.0–0.7)
HEMATOCRIT: 43.9 % (ref 36.0–46.0)
Hemoglobin: 14.7 g/dL (ref 12.0–15.0)
LYMPHS ABS: 2.1 10*3/uL (ref 0.7–4.0)
Lymphocytes Relative: 35.9 % (ref 12.0–46.0)
MCHC: 33.5 g/dL (ref 30.0–36.0)
MCV: 96 fl (ref 78.0–100.0)
MONO ABS: 0.7 10*3/uL (ref 0.1–1.0)
Monocytes Relative: 11.2 % (ref 3.0–12.0)
Neutro Abs: 2.8 10*3/uL (ref 1.4–7.7)
Neutrophils Relative %: 47.2 % (ref 43.0–77.0)
Platelets: 276 10*3/uL (ref 150.0–400.0)
RBC: 4.58 Mil/uL (ref 3.87–5.11)
RDW: 13.5 % (ref 11.5–15.5)
WBC: 5.9 10*3/uL (ref 4.0–10.5)

## 2014-02-14 LAB — HEPATIC FUNCTION PANEL
ALT: 16 U/L (ref 0–35)
AST: 24 U/L (ref 0–37)
Albumin: 3.7 g/dL (ref 3.5–5.2)
Alkaline Phosphatase: 64 U/L (ref 39–117)
Bilirubin, Direct: 0.1 mg/dL (ref 0.0–0.3)
Total Bilirubin: 0.7 mg/dL (ref 0.2–1.2)
Total Protein: 7.6 g/dL (ref 6.0–8.3)

## 2014-02-14 LAB — BASIC METABOLIC PANEL
BUN: 15 mg/dL (ref 6–23)
CHLORIDE: 106 meq/L (ref 96–112)
CO2: 25 mEq/L (ref 19–32)
Calcium: 9.3 mg/dL (ref 8.4–10.5)
Creatinine, Ser: 1 mg/dL (ref 0.4–1.2)
GFR: 57.86 mL/min — AB (ref >60.00)
GLUCOSE: 90 mg/dL (ref 70–99)
POTASSIUM: 4.4 meq/L (ref 3.5–5.1)
SODIUM: 141 meq/L (ref 135–145)

## 2014-02-14 LAB — FERRITIN: Ferritin: 19.5 ng/mL (ref 10.0–291.0)

## 2014-02-15 ENCOUNTER — Encounter: Payer: Self-pay | Admitting: Internal Medicine

## 2014-02-18 ENCOUNTER — Ambulatory Visit: Payer: Medicare PPO | Admitting: Internal Medicine

## 2014-03-11 ENCOUNTER — Ambulatory Visit: Payer: Medicare PPO

## 2014-03-11 ENCOUNTER — Encounter: Payer: Self-pay | Admitting: Internal Medicine

## 2014-03-11 ENCOUNTER — Ambulatory Visit (INDEPENDENT_AMBULATORY_CARE_PROVIDER_SITE_OTHER): Payer: Medicare PPO | Admitting: Internal Medicine

## 2014-03-11 VITALS — BP 120/70 | HR 68 | Temp 98.5°F | Ht 62.0 in | Wt 143.0 lb

## 2014-03-11 DIAGNOSIS — M949 Disorder of cartilage, unspecified: Secondary | ICD-10-CM

## 2014-03-11 DIAGNOSIS — Z23 Encounter for immunization: Secondary | ICD-10-CM

## 2014-03-11 DIAGNOSIS — E7800 Pure hypercholesterolemia, unspecified: Secondary | ICD-10-CM

## 2014-03-11 DIAGNOSIS — M899 Disorder of bone, unspecified: Secondary | ICD-10-CM

## 2014-03-11 DIAGNOSIS — M8580 Other specified disorders of bone density and structure, unspecified site: Secondary | ICD-10-CM

## 2014-03-11 DIAGNOSIS — D649 Anemia, unspecified: Secondary | ICD-10-CM

## 2014-03-11 DIAGNOSIS — C50919 Malignant neoplasm of unspecified site of unspecified female breast: Secondary | ICD-10-CM

## 2014-03-11 NOTE — Progress Notes (Signed)
Pre visit review using our clinic review tool, if applicable. No additional management support is needed unless otherwise documented below in the visit note. 

## 2014-03-13 ENCOUNTER — Encounter: Payer: Self-pay | Admitting: Internal Medicine

## 2014-03-13 NOTE — Assessment & Plan Note (Signed)
Continue calcium and vitamin D and weightbearing exercise 

## 2014-03-13 NOTE — Assessment & Plan Note (Signed)
Low cholesterol diet and exercise.  Prefers not to take medication.  Follow lipid panel.  Cholesterol just checked and LDL 147.  Discussed my desire to start cholesterol medication.  She declines.  Wants to work on diet and exercise.  States if persistent elevation on next check, will consider starting the medication.

## 2014-03-13 NOTE — Assessment & Plan Note (Signed)
Doing well.  Has been released by [facility].  Follow.  Up to date with mammograms.  Last mammogram 05/10/13 - Birads II.  Schedule f/u mammogram.

## 2014-03-13 NOTE — Assessment & Plan Note (Signed)
Saw GI.  Had EGD and colonoscopy.  Recommended f/u colonoscopy in 10 years.  Last hgb wnl.  Follow.

## 2014-03-13 NOTE — Progress Notes (Signed)
  Subjective:    Patient ID: Renee Vazquez, female    DOB: 18-Sep-1940, 73 y.o.   MRN: [id number]  HPI 73 year old female with past history of breast cancer s/p lumpectomy and hypercholesterolemia who comes in today for a scheduled follow up.   Getting out more.  Exercising.  Line dancing.   No chest pain or tightness.  Breathing stable.  Bowels stable.  Physically doing well.   No cough or chest congestion.  Eating and drinking well.      Past Medical History  Diagnosis Date  . Hypercholesterolemia   . Breast cancer     Dr Ewell Poe  . Osteopenia     Current Outpatient Prescriptions on File Prior to Visit  Medication Sig Dispense Refill  . calcium citrate-vitamin D 200-200 MG-UNIT TABS Take 1 tablet by mouth daily.      Marland Kitchen MELATONIN PO Take by mouth at bedtime as needed (sleep).      . Multiple Vitamins-Minerals (CENTRUM SILVER ADULT 50+ PO) Take by mouth.       No current facility-administered medications on file prior to visit.    Review of Systems Patient denies any headache, lightheadedness or dizziness.  No sinus or allergy symptoms.  No chest pain, tightness or palpitations.  No increased shortness of breath, cough or congestion.  No chest congestion.  No nausea or vomiting.  No acid reflux.   No abdominal pain or cramping.  No bowel change, such as diarrhea, constipation, BRBPR or melana.  No urine change.  Overall appears to be doing well.  Staying active.  Line dancing.       Objective:   Physical Exam  Filed Vitals:   03/11/14 1122  BP: 120/70  Pulse: 68  Temp: 98.5 F (36.9 C)   Blood pressure recheck:  12/20  73 year old female in no acute distress.   HEENT:  Nares- clear.  Oropharynx - without lesions. NECK:  Supple.  Nontender.  No audible bruit.  HEART:  Appears to be regular. LUNGS:  No crackles or wheezing audible.  Respirations even and unlabored.  RADIAL PULSE:  Equal bilaterally.  ABDOMEN:  Soft, nontender.  Bowel sounds present and  normal.  No audible abdominal bruit.     EXTREMITIES:  No increased edema present.  DP pulses palpable and equal bilaterally.         Assessment & Plan:  INCREASED PSYCHOSOCIAL STRESSORS.  Doing better.  Getting out more.  Exercising.  Follow.    ELEVATED BLOOD PRESSURE.  Doing well now.  Follow.    HEALTH MAINTENANCE.  Physical 08/20/13.   She declines rectal exam.  Declines further GI screening including colonoscopy (at this time).   Mammograms were followed through [facility].  Last mammogram here 05/10/13 - Birads II.   Schedule f/u mammogram.

## 2014-05-27 LAB — HM MAMMOGRAPHY

## 2014-05-30 ENCOUNTER — Encounter: Payer: Self-pay | Admitting: *Deleted

## 2014-06-07 ENCOUNTER — Encounter: Payer: Self-pay | Admitting: Internal Medicine

## 2014-06-14 ENCOUNTER — Encounter: Payer: Self-pay | Admitting: *Deleted

## 2014-06-26 ENCOUNTER — Other Ambulatory Visit: Payer: Self-pay | Admitting: Internal Medicine

## 2014-06-27 NOTE — Telephone Encounter (Signed)
Spoke with pt, she states she no longer has symptoms.  Further states she received a message from [place] no bacteria or blood in UA.  Request sent to [place] for records.

## 2014-06-27 NOTE — Telephone Encounter (Signed)
Noted.  Await records for review.

## 2014-07-29 ENCOUNTER — Encounter: Payer: Self-pay | Admitting: Internal Medicine

## 2014-08-22 ENCOUNTER — Encounter: Payer: Self-pay | Admitting: Nurse Practitioner

## 2014-08-22 ENCOUNTER — Ambulatory Visit (INDEPENDENT_AMBULATORY_CARE_PROVIDER_SITE_OTHER): Payer: Medicare PPO | Admitting: Nurse Practitioner

## 2014-08-22 VITALS — BP 138/86 | HR 82 | Temp 97.4°F | Resp 12 | Ht 62.0 in | Wt 140.0 lb

## 2014-08-22 DIAGNOSIS — Z139 Encounter for screening, unspecified: Secondary | ICD-10-CM

## 2014-08-22 DIAGNOSIS — N3001 Acute cystitis with hematuria: Secondary | ICD-10-CM | POA: Diagnosis not present

## 2014-08-22 DIAGNOSIS — N390 Urinary tract infection, site not specified: Secondary | ICD-10-CM | POA: Insufficient documentation

## 2014-08-22 LAB — POCT URINALYSIS DIPSTICK
Bilirubin, UA: NEGATIVE
Glucose, UA: NEGATIVE
KETONES UA: NEGATIVE
NITRITE UA: NEGATIVE
PROTEIN UA: NEGATIVE
Spec Grav, UA: 1.02
Urobilinogen, UA: 0.2
pH, UA: 5

## 2014-08-22 MED ORDER — SULFAMETHOXAZOLE-TRIMETHOPRIM 800-160 MG PO TABS: 1.0000 | ORAL_TABLET | Freq: Two times a day (BID) | ORAL | Status: DC

## 2014-08-22 NOTE — Assessment & Plan Note (Signed)
POCT urine shows possible UTI. Will treat with Bactrim twice daily x 5 days. Will obtain culture as well. Lots of fluids FU worsening/failure to improve.

## 2014-08-22 NOTE — Patient Instructions (Signed)
Lots of fluids.  Take 1 pill twice daily x 5 days.   Follow up with Dr. Nicki Reaper Friday if any further concerns.

## 2014-08-22 NOTE — Progress Notes (Signed)
Pre visit review using our clinic review tool, if applicable. No additional management support is needed unless otherwise documented below in the visit note. 

## 2014-08-22 NOTE — Progress Notes (Signed)
   Subjective:    Patient ID: Renee Vazquez, female    DOB: 29-Jan-1941, 74 y.o.   MRN: [id number]  HPI  Renee Vazquez is a 74 yo female with a CC of dysuria and flank pain.   1) 3 days ago spotting, 2 days of painful urination,  Dull ache on left flank to front today  Heating pad- helpful   Review of Systems  Constitutional: Negative for fever, chills, diaphoresis and fatigue.  Respiratory: Negative for chest tightness, shortness of breath and wheezing.   Cardiovascular: Negative for chest pain, palpitations and leg swelling.  Gastrointestinal: Negative for nausea, vomiting, diarrhea and rectal pain.  Genitourinary: Positive for dysuria, hematuria and flank pain. Negative for urgency and frequency.  Skin: Negative for rash.  Neurological: Negative for dizziness, weakness, numbness and headaches.  Psychiatric/Behavioral: The patient is not nervous/anxious.       Objective:   Physical Exam  Constitutional: She is oriented to person, place, and time. She appears well-developed and well-nourished. No distress.  BP 138/86 mmHg  Pulse 82  Temp(Src) 97.4 F (36.3 C) (Oral)  Resp 12  Ht 5\' 2"  (1.575 m)  Wt 140 lb (63.504 kg)  BMI 25.60 kg/m2  SpO2 97%   HENT:  Head: Normocephalic and atraumatic.  Right Ear: External ear normal.  Left Ear: External ear normal.  Cardiovascular: Normal rate, regular rhythm, normal heart sounds and intact distal pulses.  Exam reveals no gallop and no friction rub.   No murmur heard. Pulmonary/Chest: Effort normal and breath sounds normal. No respiratory distress. She has no wheezes. She has no rales. She exhibits no tenderness.  Abdominal: There is no CVA tenderness.  Neurological: She is alert and oriented to person, place, and time. No cranial nerve deficit. She exhibits normal muscle tone. Coordination normal.  Skin: Skin is warm and dry. No rash noted. She is not diaphoretic.  Psychiatric: She has a normal mood and affect. Her behavior is  normal. Judgment and thought content normal.      Assessment & Plan:

## 2014-08-24 ENCOUNTER — Other Ambulatory Visit (INDEPENDENT_AMBULATORY_CARE_PROVIDER_SITE_OTHER): Payer: Medicare PPO

## 2014-08-24 DIAGNOSIS — M8580 Other specified disorders of bone density and structure, unspecified site: Secondary | ICD-10-CM

## 2014-08-24 DIAGNOSIS — E7800 Pure hypercholesterolemia, unspecified: Secondary | ICD-10-CM

## 2014-08-24 LAB — LIPID PANEL
Cholesterol: 215 mg/dL — ABNORMAL HIGH (ref 0–200)
HDL: 64.5 mg/dL (ref >39.00)
LDL Cholesterol: 131 mg/dL — ABNORMAL HIGH (ref 0–99)
NONHDL: 150.5
Total CHOL/HDL Ratio: 3
Triglycerides: 98 mg/dL (ref 0.0–149.0)
VLDL: 19.6 mg/dL (ref 0.0–40.0)

## 2014-08-24 LAB — BASIC METABOLIC PANEL
BUN: 17 mg/dL (ref 6–23)
CHLORIDE: 108 meq/L (ref 96–112)
CO2: 25 mEq/L (ref 19–32)
CREATININE: 1.17 mg/dL (ref 0.40–1.20)
Calcium: 9.3 mg/dL (ref 8.4–10.5)
GFR: 48.2 mL/min — AB (ref >60.00)
Glucose, Bld: 91 mg/dL (ref 70–99)
Potassium: 4.4 mEq/L (ref 3.5–5.1)
Sodium: 139 mEq/L (ref 135–145)

## 2014-08-24 LAB — HEPATIC FUNCTION PANEL
ALT: 11 U/L (ref 0–35)
AST: 17 U/L (ref 0–37)
Albumin: 3.9 g/dL (ref 3.5–5.2)
Alkaline Phosphatase: 56 U/L (ref 39–117)
Bilirubin, Direct: 0.1 mg/dL (ref 0.0–0.3)
TOTAL PROTEIN: 7.5 g/dL (ref 6.0–8.3)
Total Bilirubin: 0.6 mg/dL (ref 0.2–1.2)

## 2014-08-24 LAB — URINE CULTURE
COLONY COUNT: NO GROWTH
Organism ID, Bacteria: NO GROWTH

## 2014-08-25 ENCOUNTER — Encounter: Payer: Self-pay | Admitting: Internal Medicine

## 2014-08-26 ENCOUNTER — Encounter: Payer: Self-pay | Admitting: Internal Medicine

## 2014-08-26 ENCOUNTER — Ambulatory Visit (INDEPENDENT_AMBULATORY_CARE_PROVIDER_SITE_OTHER): Payer: Medicare PPO | Admitting: Internal Medicine

## 2014-08-26 VITALS — BP 120/70 | HR 79 | Temp 98.0°F | Ht 62.0 in | Wt 140.2 lb

## 2014-08-26 DIAGNOSIS — M8580 Other specified disorders of bone density and structure, unspecified site: Secondary | ICD-10-CM

## 2014-08-26 DIAGNOSIS — N289 Disorder of kidney and ureter, unspecified: Secondary | ICD-10-CM

## 2014-08-26 DIAGNOSIS — E7800 Pure hypercholesterolemia, unspecified: Secondary | ICD-10-CM

## 2014-08-26 DIAGNOSIS — C50919 Malignant neoplasm of unspecified site of unspecified female breast: Secondary | ICD-10-CM

## 2014-08-26 DIAGNOSIS — R319 Hematuria, unspecified: Secondary | ICD-10-CM

## 2014-08-26 DIAGNOSIS — Z0000 Encounter for general adult medical examination without abnormal findings: Secondary | ICD-10-CM

## 2014-08-26 NOTE — Progress Notes (Signed)
Patient ID: Renee Vazquez, female   DOB: 03-Jun-1941, 74 y.o.   MRN: [id number]   Subjective:    Patient ID: Renee Vazquez, female    DOB: 08-22-1940, 74 y.o.   MRN: [id number]  HPI  Patient here for her physical exam.  She has a history of breast cancer and hypercholesterolemia.  States she is doing well.  Stays active.  Goes line dancing.  No cardiac symptoms with increased activity or exertion.  Breathing stable.  Had a recent UTI.  Saw Lorane Gell.  Treated.  Symptoms have resolved.  Had gross hematuria with this episode.  Also had another episode of gross hematuria recently and was evaluated at [facility].  See their note for details.  No symptoms with the first episode.  She is in the process of getting an appt with [facility].  No blood now.  No abdominal pain or back pain.    Past Medical History  Diagnosis Date  . Hypercholesterolemia   . Breast cancer     Dr Ewell Poe  . Osteopenia     Current Outpatient Prescriptions on File Prior to Visit  Medication Sig Dispense Refill  . calcium citrate-vitamin D 200-200 MG-UNIT TABS Take 1 tablet by mouth daily.    Marland Kitchen MELATONIN PO Take by mouth at bedtime as needed (sleep).    . Multiple Vitamins-Minerals (CENTRUM SILVER ADULT 50+ PO) Take by mouth.    . sulfamethoxazole-trimethoprim (BACTRIM DS,SEPTRA DS) 800-160 MG per tablet Take 1 tablet by mouth 2 (two) times daily. 10 tablet 0   No current facility-administered medications on file prior to visit.    Review of Systems  Constitutional: Negative for appetite change and unexpected weight change.  HENT: Negative for congestion and sinus pressure.   Eyes: Negative for pain and visual disturbance.  Respiratory: Negative for cough, chest tightness and shortness of breath.   Cardiovascular: Negative for chest pain, palpitations and leg swelling.  Gastrointestinal: Negative for nausea, vomiting, abdominal pain and diarrhea.  Genitourinary: Negative for  dysuria, frequency and difficulty urinating.       Previous hematuria.  None now.    Musculoskeletal: Negative for back pain and joint swelling.  Skin: Negative for color change and rash.  Neurological: Negative for dizziness, light-headedness and headaches.  Hematological: Negative for adenopathy. Does not bruise/bleed easily.  Psychiatric/Behavioral: Negative for dysphoric mood and agitation.       Objective:     Blood pressure recheck:  122/70  Physical Exam  Constitutional: She is oriented to person, place, and time. She appears well-developed and well-nourished. No distress.  HENT:  Nose: Nose normal.  Mouth/Throat: Oropharynx is clear and moist.  Neck: Neck supple. No thyromegaly present.  Cardiovascular: Normal rate and regular rhythm.   Pulmonary/Chest: Breath sounds normal. No respiratory distress. She has no wheezes.  Breast exam reveals left nipple - inverted.  Unchanged.  No nipple inversion or retraction - right.  S/p surgery - right breast.  No palpable nodules or palpable axillary adenopathy.    Abdominal: Soft. Bowel sounds are normal. There is no tenderness.  Musculoskeletal: She exhibits no edema or tenderness.  Lymphadenopathy:    She has no cervical adenopathy.  Neurological: She is alert and oriented to person, place, and time.  Skin: No rash noted. No erythema.  Psychiatric: She has a normal mood and affect. Her behavior is normal.    BP 120/70 mmHg  Pulse 79  Temp(Src) 98 F (36.7 C) (Oral)  Ht 5'  2" (1.575 m)  Wt 140 lb 4 oz (63.617 kg)  BMI 25.65 kg/m2  SpO2 96% Wt Readings from Last 3 Encounters:  08/26/14 140 lb 4 oz (63.617 kg)  08/22/14 140 lb (63.504 kg)  03/11/14 143 lb (64.864 kg)     Lab Results  Component Value Date   WBC 5.9 02/14/2014   HGB 14.7 02/14/2014   HCT 43.9 02/14/2014   PLT 276.0 02/14/2014   GLUCOSE 91 08/24/2014   CHOL 215* 08/24/2014   TRIG 98.0 08/24/2014   HDL 64.50 08/24/2014   LDLDIRECT 152.4 08/16/2013    LDLCALC 131* 08/24/2014   ALT 11 08/24/2014   AST 17 08/24/2014   NA 139 08/24/2014   K 4.4 08/24/2014   CL 108 08/24/2014   CREATININE 1.17 08/24/2014   BUN 17 08/24/2014   CO2 25 08/24/2014   TSH 2.26 09/13/2013       Assessment & Plan:   Problem List Items Addressed This Visit    Breast cancer    Doing well.  Has been released by [facility].  Mammogram 05/27/14 - Birads II.        Health care maintenance    Physical today.  Mammogram 05/27/14 - Birads II.  Colonoscopy 09/07/13 - non thrombosed hemorrhoids, diverticulosis and internal hemorrhoids.  Recommended f/u colonoscopy in 10 years.        Hematuria    Has had two episodes now.  See above.  Referred to urology.  In the process of getting her appt.  Follow.        Hypercholesterolemia    Low cholesterol diet and exercise.  LDL just checked (08/24/14) - 131.  Improved some.  Follow.  She declines cholesterol medication.       Osteopenia    Continue calcium, vitamin D and weight bearing exercise.        Renal insufficiency - Primary    Cr checked 08/24/14 - 1.17.  Follow.  Stay hydrated.        Relevant Orders   Basic metabolic panel     I spent 25 minutes with the patient and more than 50% of the time was spent in consultation regarding the above.     Einar Pheasant, MD

## 2014-08-26 NOTE — Progress Notes (Signed)
Pre visit review using our clinic review tool, if applicable. No additional management support is needed unless otherwise documented below in the visit note. 

## 2014-09-02 ENCOUNTER — Other Ambulatory Visit: Payer: Medicare PPO

## 2014-09-04 ENCOUNTER — Encounter: Payer: Self-pay | Admitting: Internal Medicine

## 2014-09-04 DIAGNOSIS — Z0000 Encounter for general adult medical examination without abnormal findings: Secondary | ICD-10-CM | POA: Insufficient documentation

## 2014-09-04 DIAGNOSIS — R319 Hematuria, unspecified: Secondary | ICD-10-CM | POA: Insufficient documentation

## 2014-09-04 NOTE — Assessment & Plan Note (Signed)
Physical today.  Mammogram 05/27/14 - Birads II.  Colonoscopy 09/07/13 - non thrombosed hemorrhoids, diverticulosis and internal hemorrhoids.  Recommended f/u colonoscopy in 10 years.

## 2014-09-04 NOTE — Assessment & Plan Note (Signed)
Cr checked 08/24/14 - 1.17.  Follow.  Stay hydrated.

## 2014-09-04 NOTE — Assessment & Plan Note (Signed)
Doing well.  Has been released by [facility].  Mammogram 05/27/14 - Birads II.

## 2014-09-04 NOTE — Assessment & Plan Note (Signed)
Has had two episodes now.  See above.  Referred to urology.  In the process of getting her appt.  Follow.

## 2014-09-04 NOTE — Assessment & Plan Note (Signed)
Low cholesterol diet and exercise.  LDL just checked (08/24/14) - 131.  Improved some.  Follow.  She declines cholesterol medication.

## 2014-09-04 NOTE — Assessment & Plan Note (Signed)
Continue calcium, vitamin D and weight bearing exercise.  

## 2014-09-14 ENCOUNTER — Encounter: Payer: Self-pay | Admitting: Internal Medicine

## 2014-09-14 DIAGNOSIS — R9389 Abnormal findings on diagnostic imaging of other specified body structures: Secondary | ICD-10-CM

## 2014-09-14 DIAGNOSIS — N949 Unspecified condition associated with female genital organs and menstrual cycle: Secondary | ICD-10-CM

## 2014-09-14 NOTE — Telephone Encounter (Signed)
Order placed for endovaginal ultrasound.

## 2014-09-23 ENCOUNTER — Encounter: Payer: Self-pay | Admitting: Internal Medicine

## 2014-09-26 ENCOUNTER — Encounter: Payer: Self-pay | Admitting: Internal Medicine

## 2014-10-14 ENCOUNTER — Encounter: Payer: Self-pay | Admitting: Internal Medicine

## 2014-11-03 ENCOUNTER — Encounter: Payer: Self-pay | Admitting: Internal Medicine

## 2014-11-04 ENCOUNTER — Encounter: Payer: Self-pay | Admitting: Internal Medicine

## 2014-12-09 DIAGNOSIS — H3531 Nonexudative age-related macular degeneration: Secondary | ICD-10-CM | POA: Diagnosis not present

## 2014-12-12 DIAGNOSIS — N8329 Other ovarian cysts: Secondary | ICD-10-CM | POA: Diagnosis not present

## 2014-12-12 DIAGNOSIS — Z853 Personal history of malignant neoplasm of breast: Secondary | ICD-10-CM | POA: Diagnosis not present

## 2014-12-12 DIAGNOSIS — Z780 Asymptomatic menopausal state: Secondary | ICD-10-CM | POA: Diagnosis not present

## 2014-12-12 DIAGNOSIS — R938 Abnormal findings on diagnostic imaging of other specified body structures: Secondary | ICD-10-CM | POA: Diagnosis not present

## 2014-12-12 DIAGNOSIS — Z01818 Encounter for other preprocedural examination: Secondary | ICD-10-CM | POA: Diagnosis not present

## 2014-12-12 DIAGNOSIS — N858 Other specified noninflammatory disorders of uterus: Secondary | ICD-10-CM | POA: Diagnosis not present

## 2014-12-12 DIAGNOSIS — D259 Leiomyoma of uterus, unspecified: Secondary | ICD-10-CM | POA: Diagnosis not present

## 2014-12-16 DIAGNOSIS — Z23 Encounter for immunization: Secondary | ICD-10-CM | POA: Diagnosis not present

## 2014-12-16 DIAGNOSIS — S81811A Laceration without foreign body, right lower leg, initial encounter: Secondary | ICD-10-CM | POA: Diagnosis not present

## 2014-12-19 DIAGNOSIS — Z5189 Encounter for other specified aftercare: Secondary | ICD-10-CM | POA: Diagnosis not present

## 2014-12-19 DIAGNOSIS — S81811D Laceration without foreign body, right lower leg, subsequent encounter: Secondary | ICD-10-CM | POA: Diagnosis not present

## 2014-12-21 DIAGNOSIS — R938 Abnormal findings on diagnostic imaging of other specified body structures: Secondary | ICD-10-CM | POA: Diagnosis not present

## 2014-12-21 DIAGNOSIS — D259 Leiomyoma of uterus, unspecified: Secondary | ICD-10-CM | POA: Diagnosis not present

## 2014-12-21 DIAGNOSIS — N8329 Other ovarian cysts: Secondary | ICD-10-CM | POA: Diagnosis not present

## 2014-12-21 DIAGNOSIS — N858 Other specified noninflammatory disorders of uterus: Secondary | ICD-10-CM | POA: Diagnosis not present

## 2014-12-21 DIAGNOSIS — N950 Postmenopausal bleeding: Secondary | ICD-10-CM | POA: Diagnosis not present

## 2014-12-21 DIAGNOSIS — Z853 Personal history of malignant neoplasm of breast: Secondary | ICD-10-CM | POA: Diagnosis not present

## 2014-12-21 DIAGNOSIS — N840 Polyp of corpus uteri: Secondary | ICD-10-CM | POA: Diagnosis not present

## 2014-12-21 DIAGNOSIS — Z9221 Personal history of antineoplastic chemotherapy: Secondary | ICD-10-CM | POA: Diagnosis not present

## 2014-12-21 DIAGNOSIS — N882 Stricture and stenosis of cervix uteri: Secondary | ICD-10-CM | POA: Diagnosis not present

## 2014-12-26 ENCOUNTER — Encounter: Payer: Self-pay | Admitting: Internal Medicine

## 2014-12-26 DIAGNOSIS — R9389 Abnormal findings on diagnostic imaging of other specified body structures: Secondary | ICD-10-CM | POA: Insufficient documentation

## 2015-01-23 DIAGNOSIS — N950 Postmenopausal bleeding: Secondary | ICD-10-CM | POA: Diagnosis not present

## 2015-02-22 ENCOUNTER — Other Ambulatory Visit (INDEPENDENT_AMBULATORY_CARE_PROVIDER_SITE_OTHER): Payer: Medicare PPO

## 2015-02-22 ENCOUNTER — Other Ambulatory Visit: Payer: Self-pay | Admitting: Internal Medicine

## 2015-02-22 DIAGNOSIS — R7989 Other specified abnormal findings of blood chemistry: Secondary | ICD-10-CM

## 2015-02-22 DIAGNOSIS — N289 Disorder of kidney and ureter, unspecified: Secondary | ICD-10-CM

## 2015-02-22 DIAGNOSIS — E7800 Pure hypercholesterolemia, unspecified: Secondary | ICD-10-CM

## 2015-02-22 LAB — BASIC METABOLIC PANEL
BUN: 17 mg/dL (ref 6–23)
CO2: 27 meq/L (ref 19–32)
Calcium: 9 mg/dL (ref 8.4–10.5)
Chloride: 108 mEq/L (ref 96–112)
Creatinine, Ser: 0.89 mg/dL (ref 0.40–1.20)
GFR: 66.01 mL/min (ref >60.00)
Glucose, Bld: 86 mg/dL (ref 70–99)
POTASSIUM: 4.1 meq/L (ref 3.5–5.1)
SODIUM: 140 meq/L (ref 135–145)

## 2015-02-22 NOTE — Progress Notes (Signed)
Order placed for add on labs.   °

## 2015-02-23 ENCOUNTER — Other Ambulatory Visit (INDEPENDENT_AMBULATORY_CARE_PROVIDER_SITE_OTHER): Payer: Medicare PPO

## 2015-02-23 DIAGNOSIS — E7800 Pure hypercholesterolemia, unspecified: Secondary | ICD-10-CM

## 2015-02-23 DIAGNOSIS — R946 Abnormal results of thyroid function studies: Secondary | ICD-10-CM

## 2015-02-23 DIAGNOSIS — R7989 Other specified abnormal findings of blood chemistry: Secondary | ICD-10-CM

## 2015-02-23 LAB — LIPID PANEL
Cholesterol: 206 mg/dL — ABNORMAL HIGH (ref 0–200)
HDL: 59 mg/dL (ref >39.00)
LDL Cholesterol: 126 mg/dL — ABNORMAL HIGH (ref 0–99)
NONHDL: 147.01
Total CHOL/HDL Ratio: 3
Triglycerides: 107 mg/dL (ref 0.0–149.0)
VLDL: 21.4 mg/dL (ref 0.0–40.0)

## 2015-02-23 LAB — TSH: TSH: 3.8 u[IU]/mL (ref 0.35–4.50)

## 2015-02-23 LAB — HEPATIC FUNCTION PANEL
ALBUMIN: 3.7 g/dL (ref 3.5–5.2)
ALK PHOS: 58 U/L (ref 39–117)
ALT: 11 U/L (ref 0–35)
AST: 18 U/L (ref 0–37)
BILIRUBIN DIRECT: 0.1 mg/dL (ref 0.0–0.3)
Total Bilirubin: 0.5 mg/dL (ref 0.2–1.2)
Total Protein: 7.1 g/dL (ref 6.0–8.3)

## 2015-02-24 ENCOUNTER — Encounter: Payer: Self-pay | Admitting: Internal Medicine

## 2015-03-01 ENCOUNTER — Encounter: Payer: Self-pay | Admitting: Internal Medicine

## 2015-03-01 ENCOUNTER — Ambulatory Visit (INDEPENDENT_AMBULATORY_CARE_PROVIDER_SITE_OTHER): Payer: Medicare PPO | Admitting: Internal Medicine

## 2015-03-01 VITALS — BP 120/70 | HR 73 | Temp 98.2°F | Ht 62.0 in | Wt 139.2 lb

## 2015-03-01 DIAGNOSIS — C50919 Malignant neoplasm of unspecified site of unspecified female breast: Secondary | ICD-10-CM | POA: Diagnosis not present

## 2015-03-01 DIAGNOSIS — N289 Disorder of kidney and ureter, unspecified: Secondary | ICD-10-CM

## 2015-03-01 DIAGNOSIS — D649 Anemia, unspecified: Secondary | ICD-10-CM

## 2015-03-01 DIAGNOSIS — R9389 Abnormal findings on diagnostic imaging of other specified body structures: Secondary | ICD-10-CM

## 2015-03-01 DIAGNOSIS — M8580 Other specified disorders of bone density and structure, unspecified site: Secondary | ICD-10-CM

## 2015-03-01 DIAGNOSIS — R319 Hematuria, unspecified: Secondary | ICD-10-CM

## 2015-03-01 DIAGNOSIS — Z1239 Encounter for other screening for malignant neoplasm of breast: Secondary | ICD-10-CM

## 2015-03-01 DIAGNOSIS — R938 Abnormal findings on diagnostic imaging of other specified body structures: Secondary | ICD-10-CM

## 2015-03-01 DIAGNOSIS — R87610 Atypical squamous cells of undetermined significance on cytologic smear of cervix (ASC-US): Secondary | ICD-10-CM

## 2015-03-01 DIAGNOSIS — E7800 Pure hypercholesterolemia, unspecified: Secondary | ICD-10-CM

## 2015-03-01 MED ORDER — SCOPOLAMINE 1 MG/3DAYS TD PT72: 1.0000 | MEDICATED_PATCH | TRANSDERMAL | Status: DC

## 2015-03-01 NOTE — Progress Notes (Signed)
Patient ID: Renee Vazquez, female   DOB: 06-Jan-1941, 74 y.o.   MRN: [id number]   Subjective:    Patient ID: Renee Vazquez, female    DOB: 17-May-1941, 74 y.o.   MRN: [id number]  HPI  Patient here for a scheduled follow up.  She had noticed some post menopausal bleeding.  Found to have thickened endometrium.  Is s/p hysteroscopy D&C and polypectomy.  Endometrial biopsy - benign.  Atrophic endometrial polyp.  Had PAP.  ASCUS with HPV negative.  Recommended f/u pap in 12 months.  No further bleeding.  No abdominal pain or cramping.  Stays active.  Is line dancing.  No cardiac symptoms with increased activity or exertion.  No sob.  Eating and drinking well.  No nausea or vomiting.  Bowels stable.  Planning for cruise.  Request scopolamine.     Past Medical History  Diagnosis Date  . Hypercholesterolemia   . Breast cancer     Dr Ewell Poe  . Osteopenia    Past Surgical History  Procedure Laterality Date  . Eye surgery      detached retina  . Breast lumpectomy  2007   Family History  Problem Relation Age of Onset  . Hypertension Mother   . Hypercholesterolemia Mother   . Prostate cancer Father   . Breast cancer Maternal Aunt   . Melanoma Sister   . Colon cancer Neg Hx    Social History   Social History  . Marital Status: Divorced    Spouse Name: N/A  . Number of Children: 4  . Years of Education: N/A   Occupational History  . retired    Social History Main Topics  . Smoking status: Never Smoker   . Smokeless tobacco: Never Used  . Alcohol Use: 0.0 oz/week    0 Standard drinks or equivalent per week     Comment: occasional  . Drug Use: No  . Sexual Activity: Not Asked   Other Topics Concern  . None   Social History Narrative    Outpatient Encounter Prescriptions as of 03/01/2015  Medication Sig  . calcium citrate-vitamin D 200-200 MG-UNIT TABS Take 1 tablet by mouth daily.  Marland Kitchen MELATONIN PO Take by mouth at bedtime as needed (sleep).  . Multiple  Vitamins-Minerals (CENTRUM SILVER ADULT 50+ PO) Take by mouth.  Marland Kitchen scopolamine (TRANSDERM-SCOP, 1.5 MG,) 1 MG/3DAYS Place 1 patch (1.5 mg total) onto the skin every 3 (three) days.  . [DISCONTINUED] sulfamethoxazole-trimethoprim (BACTRIM DS,SEPTRA DS) 800-160 MG per tablet Take 1 tablet by mouth 2 (two) times daily.   No facility-administered encounter medications on file as of 03/01/2015.    Review of Systems  Constitutional: Negative for appetite change and unexpected weight change.  HENT: Negative for congestion and sinus pressure.   Eyes: Negative for pain and discharge.  Respiratory: Negative for cough, chest tightness and shortness of breath.   Cardiovascular: Negative for chest pain, palpitations and leg swelling.  Gastrointestinal: Negative for nausea, vomiting, abdominal pain and diarrhea.  Genitourinary: Negative for dysuria and difficulty urinating.  Musculoskeletal: Negative for back pain and joint swelling.  Skin: Negative for color change and rash.  Neurological: Negative for dizziness, light-headedness and headaches.  Psychiatric/Behavioral: Negative for dysphoric mood and agitation.       Objective:   blood pressure rechecked by me:  134/72  Physical Exam  Constitutional: She appears well-developed and well-nourished. No distress.  HENT:  Nose: Nose normal.  Mouth/Throat: Oropharynx is clear and moist.  Eyes: Right  eye exhibits no discharge. Left eye exhibits no discharge. No scleral icterus.  Neck: Neck supple. No thyromegaly present.  Cardiovascular: Normal rate and regular rhythm.   Pulmonary/Chest: Breath sounds normal. No respiratory distress. She has no wheezes.  Abdominal: Soft. Bowel sounds are normal. There is no tenderness.  Musculoskeletal: She exhibits no edema or tenderness.  Lymphadenopathy:    She has no cervical adenopathy.  Skin: No rash noted. No erythema.  Psychiatric: She has a normal mood and affect. Her behavior is normal.    BP 120/70 mmHg   Pulse 73  Temp(Src) 98.2 F (36.8 C) (Oral)  Ht 5\' 2"  (1.575 m)  Wt 139 lb 4 oz (63.163 kg)  BMI 25.46 kg/m2  SpO2 97% Wt Readings from Last 3 Encounters:  03/01/15 139 lb 4 oz (63.163 kg)  08/26/14 140 lb 4 oz (63.617 kg)  08/22/14 140 lb (63.504 kg)     Lab Results  Component Value Date   WBC 5.9 02/14/2014   HGB 14.7 02/14/2014   HCT 43.9 02/14/2014   PLT 276.0 02/14/2014   GLUCOSE 86 02/22/2015   CHOL 206* 02/23/2015   TRIG 107.0 02/23/2015   HDL 59.00 02/23/2015   LDLDIRECT 152.4 08/16/2013   LDLCALC 126* 02/23/2015   ALT 11 02/23/2015   AST 18 02/23/2015   NA 140 02/22/2015   K 4.1 02/22/2015   CL 108 02/22/2015   CREATININE 0.89 02/22/2015   BUN 17 02/22/2015   CO2 27 02/22/2015   TSH 3.80 02/23/2015       Assessment & Plan:   Problem List Items Addressed This Visit    Abnormal Pap smear of cervix    ASCUS on recent pap.  Negative HPV.  Recommended f/u pap in one year.        Anemia    Saw GI.  Had EGD and colonoscopy as outlined.  Recommended f/u colonoscopy in 10 yers.  Follow cbc.       Breast cancer    Has been released by [facility].  Mammogram 05/27/14 - Birads II.  Schedule f/u mammogram.       Hematuria    Evaluated by gyn.  See above.  Follow.  No further bleeding.        Hypercholesterolemia    Low cholesterol diet and exercise.  Follow lipid panel.        Relevant Orders   Lipid panel   Hepatic function panel   Basic metabolic panel   Osteopenia    Continue calcium, vitamin D and weight bearing exercise.        Renal insufficiency    Cr 02/22/15 - .89.  Doing well.  Follow.        Thickened endometrium    Previous bleeding.  Thickened endometrium.  S/p hysteroscopy D&D and polypectomy.  Endo bx benign per report.  No further bleeding.  Continues f/u with gyn.        Relevant Orders   CBC with Differential/Platelet    Other Visit Diagnoses    Screening breast examination    -  Primary    Relevant Orders    MM DIGITAL  SCREENING BILATERAL        Einar Pheasant, MD

## 2015-03-01 NOTE — Progress Notes (Signed)
Pre-visit discussion using our clinic review tool. No additional management support is needed unless otherwise documented below in the visit note.  

## 2015-03-05 ENCOUNTER — Encounter: Payer: Self-pay | Admitting: Internal Medicine

## 2015-03-05 DIAGNOSIS — R87619 Unspecified abnormal cytological findings in specimens from cervix uteri: Secondary | ICD-10-CM | POA: Insufficient documentation

## 2015-03-05 NOTE — Assessment & Plan Note (Signed)
ASCUS on recent pap.  Negative HPV.  Recommended f/u pap in one year.

## 2015-03-05 NOTE — Assessment & Plan Note (Signed)
Has been released by [facility].  Mammogram 05/27/14 - Birads II.  Schedule f/u mammogram.

## 2015-03-05 NOTE — Assessment & Plan Note (Signed)
Low cholesterol diet and exercise.  Follow lipid panel.   

## 2015-03-05 NOTE — Assessment & Plan Note (Signed)
Saw GI.  Had EGD and colonoscopy as outlined.  Recommended f/u colonoscopy in 10 yers.  Follow cbc.

## 2015-03-05 NOTE — Assessment & Plan Note (Signed)
Evaluated by gyn.  See above.  Follow.  No further bleeding.

## 2015-03-05 NOTE — Assessment & Plan Note (Signed)
Continue calcium, vitamin D and weight bearing exercise.  

## 2015-03-05 NOTE — Assessment & Plan Note (Signed)
Previous bleeding.  Thickened endometrium.  S/p hysteroscopy D&D and polypectomy.  Endo bx benign per report.  No further bleeding.  Continues f/u with gyn.

## 2015-03-05 NOTE — Assessment & Plan Note (Signed)
Cr 02/22/15 - .89.  Doing well.  Follow.

## 2015-03-06 DIAGNOSIS — D179 Benign lipomatous neoplasm, unspecified: Secondary | ICD-10-CM | POA: Diagnosis not present

## 2015-03-06 DIAGNOSIS — R208 Other disturbances of skin sensation: Secondary | ICD-10-CM | POA: Diagnosis not present

## 2015-03-06 DIAGNOSIS — R234 Changes in skin texture: Secondary | ICD-10-CM | POA: Diagnosis not present

## 2015-04-25 DIAGNOSIS — D1721 Benign lipomatous neoplasm of skin and subcutaneous tissue of right arm: Secondary | ICD-10-CM | POA: Diagnosis not present

## 2015-04-25 DIAGNOSIS — D485 Neoplasm of uncertain behavior of skin: Secondary | ICD-10-CM | POA: Diagnosis not present

## 2015-05-30 DIAGNOSIS — Z853 Personal history of malignant neoplasm of breast: Secondary | ICD-10-CM | POA: Diagnosis not present

## 2015-05-30 DIAGNOSIS — R928 Other abnormal and inconclusive findings on diagnostic imaging of breast: Secondary | ICD-10-CM | POA: Diagnosis not present

## 2015-05-30 LAB — HM MAMMOGRAPHY

## 2015-05-31 ENCOUNTER — Encounter: Payer: Self-pay | Admitting: Internal Medicine

## 2015-09-01 ENCOUNTER — Telehealth: Payer: Self-pay | Admitting: Internal Medicine

## 2015-09-01 NOTE — Telephone Encounter (Signed)
Left msg to call office to schedule AWV with Denisa/msn °

## 2015-09-11 ENCOUNTER — Other Ambulatory Visit (INDEPENDENT_AMBULATORY_CARE_PROVIDER_SITE_OTHER): Payer: Medicare Other

## 2015-09-11 ENCOUNTER — Encounter: Payer: Self-pay | Admitting: Internal Medicine

## 2015-09-11 DIAGNOSIS — R9389 Abnormal findings on diagnostic imaging of other specified body structures: Secondary | ICD-10-CM

## 2015-09-11 DIAGNOSIS — E7800 Pure hypercholesterolemia, unspecified: Secondary | ICD-10-CM | POA: Diagnosis not present

## 2015-09-11 DIAGNOSIS — R938 Abnormal findings on diagnostic imaging of other specified body structures: Secondary | ICD-10-CM

## 2015-09-11 LAB — BASIC METABOLIC PANEL
BUN: 20 mg/dL (ref 6–23)
CALCIUM: 9.7 mg/dL (ref 8.4–10.5)
CO2: 29 meq/L (ref 19–32)
CREATININE: 0.94 mg/dL (ref 0.40–1.20)
Chloride: 102 mEq/L (ref 96–112)
GFR: 61.88 mL/min (ref >60.00)
Glucose, Bld: 94 mg/dL (ref 70–99)
Potassium: 4.3 mEq/L (ref 3.5–5.1)
SODIUM: 137 meq/L (ref 135–145)

## 2015-09-11 LAB — LIPID PANEL
CHOLESTEROL: 212 mg/dL — AB (ref 0–200)
HDL: 61.5 mg/dL (ref >39.00)
LDL Cholesterol: 116 mg/dL — ABNORMAL HIGH (ref 0–99)
NONHDL: 150.33
Total CHOL/HDL Ratio: 3
Triglycerides: 173 mg/dL — ABNORMAL HIGH (ref 0.0–149.0)
VLDL: 34.6 mg/dL (ref 0.0–40.0)

## 2015-09-11 LAB — CBC WITH DIFFERENTIAL/PLATELET
Basophils Absolute: 0 10*3/uL (ref 0.0–0.1)
Basophils Relative: 0.9 % (ref 0.0–3.0)
EOS PCT: 3.5 % (ref 0.0–5.0)
Eosinophils Absolute: 0.2 10*3/uL (ref 0.0–0.7)
HCT: 42.6 % (ref 36.0–46.0)
Hemoglobin: 14.3 g/dL (ref 12.0–15.0)
LYMPHS ABS: 1.9 10*3/uL (ref 0.7–4.0)
Lymphocytes Relative: 37.2 % (ref 12.0–46.0)
MCHC: 33.5 g/dL (ref 30.0–36.0)
MCV: 93.8 fl (ref 78.0–100.0)
MONO ABS: 0.6 10*3/uL (ref 0.1–1.0)
Monocytes Relative: 11.9 % (ref 3.0–12.0)
NEUTROS ABS: 2.4 10*3/uL (ref 1.4–7.7)
NEUTROS PCT: 46.5 % (ref 43.0–77.0)
PLATELETS: 264 10*3/uL (ref 150.0–400.0)
RBC: 4.54 Mil/uL (ref 3.87–5.11)
RDW: 13.7 % (ref 11.5–15.5)
WBC: 5.1 10*3/uL (ref 4.0–10.5)

## 2015-09-11 LAB — HEPATIC FUNCTION PANEL
ALK PHOS: 61 U/L (ref 39–117)
ALT: 20 U/L (ref 0–35)
AST: 27 U/L (ref 0–37)
Albumin: 3.8 g/dL (ref 3.5–5.2)
BILIRUBIN DIRECT: 0.2 mg/dL (ref 0.0–0.3)
BILIRUBIN TOTAL: 0.6 mg/dL (ref 0.2–1.2)
Total Protein: 7.4 g/dL (ref 6.0–8.3)

## 2015-09-13 ENCOUNTER — Encounter: Payer: Self-pay | Admitting: Internal Medicine

## 2015-09-13 ENCOUNTER — Ambulatory Visit (INDEPENDENT_AMBULATORY_CARE_PROVIDER_SITE_OTHER): Payer: Medicare Other | Admitting: Internal Medicine

## 2015-09-13 VITALS — BP 124/80 | HR 70 | Temp 97.7°F | Resp 18 | Ht 61.5 in | Wt 141.1 lb

## 2015-09-13 DIAGNOSIS — R87610 Atypical squamous cells of undetermined significance on cytologic smear of cervix (ASC-US): Secondary | ICD-10-CM

## 2015-09-13 DIAGNOSIS — N6452 Nipple discharge: Secondary | ICD-10-CM

## 2015-09-13 DIAGNOSIS — R9389 Abnormal findings on diagnostic imaging of other specified body structures: Secondary | ICD-10-CM

## 2015-09-13 DIAGNOSIS — E7800 Pure hypercholesterolemia, unspecified: Secondary | ICD-10-CM

## 2015-09-13 DIAGNOSIS — R938 Abnormal findings on diagnostic imaging of other specified body structures: Secondary | ICD-10-CM

## 2015-09-13 DIAGNOSIS — M8580 Other specified disorders of bone density and structure, unspecified site: Secondary | ICD-10-CM

## 2015-09-13 DIAGNOSIS — Z0000 Encounter for general adult medical examination without abnormal findings: Secondary | ICD-10-CM

## 2015-09-13 DIAGNOSIS — Z853 Personal history of malignant neoplasm of breast: Secondary | ICD-10-CM | POA: Diagnosis not present

## 2015-09-13 DIAGNOSIS — Z23 Encounter for immunization: Secondary | ICD-10-CM

## 2015-09-13 NOTE — Assessment & Plan Note (Signed)
Nipple discharge noted on today's exam as outlined.  Had mammogram 05/2015 - Birads II.  Will refer back to oncology (Dr Carman Ching or Jeri Lager) for further evaluation.

## 2015-09-13 NOTE — Assessment & Plan Note (Signed)
Continue calcium, vitamin d and weight bearing exercise.

## 2015-09-13 NOTE — Progress Notes (Signed)
Pre-visit discussion using our clinic review tool. No additional management support is needed unless otherwise documented below in the visit note.  

## 2015-09-13 NOTE — Progress Notes (Signed)
Patient ID: Renee Vazquez, female   DOB: 09-02-1940, 75 y.o.   MRN: [id number]   Subjective:    Patient ID: Renee Vazquez, female    DOB: January 06, 1941, 75 y.o.   MRN: [id number]  HPI  Patient with past history of breast cancer and hypercholesterolemia.  She comes in today to follow up on these issues as well as for her physical exam.  She feels good.  Went on a cruise recently.  Is exercising.  Line dancing - 5-6 hours per week.  Some sinus and allergy symptoms.  Taking otc sinus medication and antihistamine. Still some nasal congestion.  Saw gyn previously.  Is s/p hysteroscopy D&C with polypectomy.  Biopsy ok.  Pap with ASCUS - negative HPV.  Recommended f/u pap in one year.  No chest pain or tightness.  No sob.  No abdominal pain or cramping.  Bowels stable.  Had mammogram 05/30/15 - Birads II.     Past Medical History  Diagnosis Date  . Hypercholesterolemia   . Breast cancer ([place])     Dr Ewell Poe  . Osteopenia    Past Surgical History  Procedure Laterality Date  . Eye surgery      detached retina  . Breast lumpectomy  2007   Family History  Problem Relation Age of Onset  . Hypertension Mother   . Hypercholesterolemia Mother   . Prostate cancer Father   . Breast cancer Maternal Aunt   . Melanoma Sister   . Colon cancer Neg Hx    Social History   Social History  . Marital Status: Divorced    Spouse Name: N/A  . Number of Children: 4  . Years of Education: N/A   Occupational History  . retired    Social History Main Topics  . Smoking status: Never Smoker   . Smokeless tobacco: Never Used  . Alcohol Use: 0.0 oz/week    0 Standard drinks or equivalent per week     Comment: occasional  . Drug Use: No  . Sexual Activity: Not Asked   Other Topics Concern  . None   Social History Narrative    Outpatient Encounter Prescriptions as of 09/13/2015  Medication Sig  . calcium citrate-vitamin D 200-200 MG-UNIT TABS Take 1 tablet by mouth daily.  Marland Kitchen  MELATONIN PO Take by mouth at bedtime as needed (sleep).  . Multiple Vitamins-Minerals (CENTRUM SILVER ADULT 50+ PO) Take by mouth.  . [DISCONTINUED] scopolamine (TRANSDERM-SCOP, 1.5 MG,) 1 MG/3DAYS Place 1 patch (1.5 mg total) onto the skin every 3 (three) days.   No facility-administered encounter medications on file as of 09/13/2015.    Review of Systems  Constitutional: Negative for appetite change and unexpected weight change.  HENT: Positive for congestion (nasal congestion. ). Negative for sinus pressure and sore throat.   Eyes: Negative for pain and visual disturbance.  Respiratory: Negative for cough, chest tightness and shortness of breath.   Cardiovascular: Negative for chest pain, palpitations and leg swelling.  Gastrointestinal: Negative for nausea, abdominal pain and diarrhea.  Genitourinary: Negative for dysuria and difficulty urinating.  Musculoskeletal: Negative for back pain and joint swelling.  Skin: Negative for color change and rash.  Neurological: Negative for dizziness, light-headedness and headaches.  Hematological: Negative for adenopathy. Does not bruise/bleed easily.  Psychiatric/Behavioral: Negative for dysphoric mood and agitation.       Objective:    Physical Exam  Constitutional: She appears well-nourished. No distress.  HENT:  Nose: Nose normal.  Mouth/Throat: Oropharynx is  clear and moist.  Eyes: Conjunctivae are normal. Right eye exhibits no discharge. Left eye exhibits no discharge.  Neck: Neck supple. No thyromegaly present.  Cardiovascular: Normal rate and regular rhythm.   Pulmonary/Chest: Breath sounds normal. No respiratory distress. She has no wheezes.  Breast exam - right - well healed incision sites.  No nipple discharge right breast.  No axillary adenopathy.  Left breast - brown nipple discharge (appeared to be some mild blood).  Nipple slightly inverted.  No breast nodule palpable.  No axillary adenopathy appreciated.    Abdominal: Soft.  Bowel sounds are normal. There is no tenderness.  Musculoskeletal: She exhibits no edema or tenderness.  Lymphadenopathy:    She has no cervical adenopathy.  Skin: No rash noted. No erythema.  Psychiatric: She has a normal mood and affect. Her behavior is normal.    BP 124/80 mmHg  Pulse 70  Temp(Src) 97.7 F (36.5 C) (Oral)  Resp 18  Ht 5' 1.5" (1.562 m)  Wt 141 lb 2 oz (64.014 kg)  BMI 26.24 kg/m2  SpO2 95% Wt Readings from Last 3 Encounters:  09/13/15 141 lb 2 oz (64.014 kg)  03/01/15 139 lb 4 oz (63.163 kg)  08/26/14 140 lb 4 oz (63.617 kg)     Lab Results  Component Value Date   WBC 5.1 09/11/2015   HGB 14.3 09/11/2015   HCT 42.6 09/11/2015   PLT 264.0 09/11/2015   GLUCOSE 94 09/11/2015   CHOL 212* 09/11/2015   TRIG 173.0* 09/11/2015   HDL 61.50 09/11/2015   LDLDIRECT 152.4 08/16/2013   LDLCALC 116* 09/11/2015   ALT 20 09/11/2015   AST 27 09/11/2015   NA 137 09/11/2015   K 4.3 09/11/2015   CL 102 09/11/2015   CREATININE 0.94 09/11/2015   BUN 20 09/11/2015   CO2 29 09/11/2015   TSH 3.80 02/23/2015        Assessment & Plan:   Problem List Items Addressed This Visit    Abnormal Pap smear of cervix    ASCUS noted on recent pap.  Negative HPV.  Recommended f/u pap in one year.  Will have through gyn.       Health care maintenance    Physical today 09/13/15.  Mammogram 05/30/15 - Birads II.  Colonoscopy 09/07/13 recommended f/u colonoscopy in 10 years.   Pneumovax given today.        History of breast cancer    Nipple discharge noted on today's exam as outlined.  Had mammogram 05/2015 - Birads II.  Will refer back to oncology (Dr Carman Ching or Jeri Lager) for further evaluation.        Relevant Orders   Ambulatory referral to Oncology   Hypercholesterolemia    On no cholesterol medication.  Low cholesterol diet and exercise.  LDL 116.  Triglycerides increased slightly.  Follow.        Osteopenia    Continue calcium, vitamin d and weight bearing  exercise.        Thickened endometrium    Previous bleeding.  Found to have thickened endometrium.  S/p hysteroscopy, D&C and polypectomy.  Endo biopsy - benign.  PAP with ascus.  Recommended f/u pap in one year.         Other Visit Diagnoses    Nipple discharge    -  Primary    Relevant Orders    Prolactin    Ambulatory referral to Oncology    Need for prophylactic vaccination against Streptococcus pneumoniae (pneumococcus)  Relevant Orders    Pneumococcal polysaccharide vaccine 23-valent greater than or equal to 2yo subcutaneous/IM (Completed)        Einar Pheasant, MD

## 2015-09-13 NOTE — Assessment & Plan Note (Signed)
On no cholesterol medication.  Low cholesterol diet and exercise.  LDL 116.  Triglycerides increased slightly.  Follow.

## 2015-09-13 NOTE — Assessment & Plan Note (Signed)
ASCUS noted on recent pap.  Negative HPV.  Recommended f/u pap in one year.  Will have through gyn.

## 2015-09-13 NOTE — Assessment & Plan Note (Signed)
Previous bleeding.  Found to have thickened endometrium.  S/p hysteroscopy, D&C and polypectomy.  Endo biopsy - benign.  PAP with ascus.  Recommended f/u pap in one year.

## 2015-09-13 NOTE — Assessment & Plan Note (Signed)
Physical today 09/13/15.  Mammogram 05/30/15 - Birads II.  Colonoscopy 09/07/13 recommended f/u colonoscopy in 10 years.   Pneumovax given today.

## 2015-09-14 ENCOUNTER — Encounter: Payer: Self-pay | Admitting: Internal Medicine

## 2015-09-14 LAB — PROLACTIN: Prolactin: 6.9 ng/mL

## 2015-09-15 NOTE — Telephone Encounter (Signed)
Unread mychart message mailed to patient 

## 2015-09-18 ENCOUNTER — Ambulatory Visit (INDEPENDENT_AMBULATORY_CARE_PROVIDER_SITE_OTHER): Payer: Medicare Other

## 2015-09-18 VITALS — BP 110/70 | HR 78 | Temp 97.4°F | Resp 12 | Ht 61.5 in | Wt 140.8 lb

## 2015-09-18 DIAGNOSIS — Z0000 Encounter for general adult medical examination without abnormal findings: Secondary | ICD-10-CM

## 2015-09-18 NOTE — Patient Instructions (Addendum)
  Renee Vazquez , Thank you for taking time to come for your Medicare Wellness Visit. I appreciate your ongoing commitment to your health goals. Please review the following plan we discussed and let me know if I can assist you in the future.    Follow up with Dr. Nicki Reaper as needed.  This is a list of the screening recommended for you and due dates:  Health Maintenance  Topic Date Due  . DEXA scan (bone density measurement)  09/17/2016*  . Flu Shot  01/23/2016  . Mammogram  05/29/2017  . Colon Cancer Screening  09/08/2023  . Tetanus Vaccine  12/01/2024  . Shingles Vaccine  Completed  . Pneumonia vaccines  Completed  *Topic was postponed. The date shown is not the original due date.    Bone Densitometry Bone densitometry is an imaging test that uses a special X-ray to measure the amount of calcium and other minerals in your bones (bone density). This test is also known as a bone mineral density test or dual-energy X-ray absorptiometry (DXA). The test can measure bone density at your hip and your spine. It is similar to having a regular X-ray. You may have this test to:  Diagnose a condition that causes weak or thin bones (osteoporosis).  Predict your risk of a broken bone (fracture).  Determine how well osteoporosis treatment is working. LET [facility] CARE PROVIDER KNOW ABOUT:  Any allergies you have.  All medicines you are taking, including vitamins, herbs, eye drops, creams, and over-the-counter medicines.  Previous problems you or members of your family have had with the use of anesthetics.  Any blood disorders you have.  Previous surgeries you have had.  Medical conditions you have.  Possibility of pregnancy.  Any other medical test you had within the previous 14 days that used contrast material. RISKS AND COMPLICATIONS Generally, this is a safe procedure. However, problems can occur and may include the following:  This test exposes you to a very small amount of  radiation.  The risks of radiation exposure may be greater to unborn children. BEFORE THE PROCEDURE  Do not take any calcium supplements for 24 hours before having the test. You can otherwise eat and drink what you usually do.  Take off all metal jewelry, eyeglasses, dental appliances, and any other metal objects. PROCEDURE  You may lie on an exam table. There will be an X-ray generator below you and an imaging device above you.  Other devices, such as boxes or braces, may be used to position your body properly for the scan.  You will need to lie still while the machine slowly scans your body.  The images will show up on a computer monitor. AFTER THE PROCEDURE You may need more testing at a later time.   This information is not intended to replace advice given to you by your health care provider. Make sure you discuss any questions you have with your health care provider.   Document Released: 07/02/2004 Document Revised: 07/01/2014 Document Reviewed: 11/18/2013 Elsevier Interactive Patient Education [employer].

## 2015-09-18 NOTE — Progress Notes (Signed)
Subjective:   Renee Vazquez is a 75 y.o. female who presents for an Initial Medicare Annual Wellness Visit.  Review of Systems    No ROS.  Medicare Wellness Visit.  Cardiac Risk Factors include: advanced age (>77men, >24 women)     Objective:    Today's Vitals   09/18/15 0932  BP: 110/70  Pulse: 78  Temp: 97.4 F (36.3 C)  TempSrc: Oral  Resp: 12  Height: 5' 1.5" (1.562 m)  Weight: 140 lb 12.8 oz (63.866 kg)  SpO2: 99%   Body mass index is 26.18 kg/(m^2).   Current Medications (verified) Outpatient Encounter Prescriptions as of 09/18/2015  Medication Sig  . calcium citrate-vitamin D 200-200 MG-UNIT TABS Take 1 tablet by mouth daily.  Marland Kitchen MELATONIN PO Take by mouth at bedtime as needed (sleep).  . Multiple Vitamins-Minerals (CENTRUM SILVER ADULT 50+ PO) Take by mouth.   No facility-administered encounter medications on file as of 09/18/2015.    Allergies (verified) No known drug allergy   History: Past Medical History  Diagnosis Date  . Hypercholesterolemia   . Breast cancer ([place])     Dr Ewell Poe  . Osteopenia    Past Surgical History  Procedure Laterality Date  . Eye surgery      detached retina  . Breast lumpectomy  2007   Family History  Problem Relation Age of Onset  . Hypertension Mother   . Hypercholesterolemia Mother   . Prostate cancer Father   . Breast cancer Maternal Aunt   . Melanoma Sister   . Colon cancer Neg Hx    Social History   Occupational History  . retired    Social History Main Topics  . Smoking status: Never Smoker   . Smokeless tobacco: Never Used  . Alcohol Use: 0.0 oz/week    0 Standard drinks or equivalent per week     Comment: occasional  . Drug Use: No  . Sexual Activity: No    Tobacco Counseling Counseling given: Not Answered   Activities of Daily Living In your present state of health, do you have any difficulty performing the following activities: 09/18/2015  Hearing? N  Vision? N    Difficulty concentrating or making decisions? N  Walking or climbing stairs? N  Dressing or bathing? N  Doing errands, shopping? N  Preparing Food and eating ? N  Using the Toilet? N  In the past six months, have you accidently leaked urine? N  Do you have problems with loss of bowel control? N  Managing your Medications? N  Managing your Finances? N  Housekeeping or managing your Housekeeping? N    Immunizations and Health Maintenance Immunization History  Administered Date(s) Administered  . Influenza Split 04/15/2014  . Influenza-Unspecified 04/06/2015  . Pneumococcal Conjugate-13 03/11/2014  . Pneumococcal Polysaccharide-23 09/13/2015  . Tdap 12/02/2014  . Zoster 08/17/2012   There are no preventive care reminders to display for this patient.  Patient Care Team: Einar Pheasant, MD as PCP - General (Internal Medicine)  Indicate any recent Medical Services you may have received from other than [facility] providers in the past year (date may be approximate).     Assessment:   This is a routine wellness examination for Renee Vazquez. The goal of the wellness visit is to assist the patient how to close the gaps in care and create a preventative care plan for the patient.   Taking VIT D as appropriate/Osteoporosis risk reviewed.  DEXA Scan postponed, per patient request.  Educational  material provided.  Medications reviewed; taking without issues or barriers.  Safety issues reviewed; smoke detectors in the home. No firearms in the home. Wears seatbelts when driving or riding with others. No violence in the home.  No identified risk were noted; The patient was oriented x 3; appropriate in dress and manner and no objective failures at ADL's or IADL's.   Malignant neoplasm-stable and followed by Oncology and Dr. Nicki Reaper   Patient Concerns:  None at this time. Follow up with PCP as needed.   Hearing/Vision screen Hearing Screening Comments: Passes the whisper test. Vision  Screening Comments: Followed by [facility] Annual Visits Hx of detached retina surgery  Wears glasses only wihen driving  Dietary issues and exercise activities discussed: Current Exercise Habits: Structured exercise class, Frequency (Times/Week): 5, Intensity: Intense  Goals    . Healthy Lifestyle     Maintain exercise regiment of daily dancing. Stay hydrated and drink plenty of water. Low carb foods.  Lean meats, fruits and vegetables.      Depression Screen PHQ 2/9 Scores 09/18/2015 09/13/2015 08/26/2014 08/22/2014 08/20/2013 08/11/2012  PHQ - 2 Score 0 0 0 0 0 0    Fall Risk Fall Risk  09/18/2015 09/13/2015 08/26/2014 08/22/2014 08/20/2013  Falls in the past year? No No No No No    Cognitive Function: MMSE - Mini Mental State Exam 09/18/2015  Orientation to time 5  Orientation to Place 5  Registration 3  Attention/ Calculation 5  Recall 3  Language- name 2 objects 2  Language- repeat 1  Language- follow 3 step command 3  Language- read & follow direction 1  Write a sentence 1  Copy design 1  Total score 30    Screening Tests Health Maintenance  Topic Date Due  . DEXA SCAN  09/17/2016 (Originally 09/27/2006)  . INFLUENZA VACCINE  01/23/2016  . MAMMOGRAM  05/29/2017  . COLONOSCOPY  09/08/2023  . TETANUS/TDAP  12/01/2024  . ZOSTAVAX  Completed  . PNA vac Low Risk Adult  Completed      Plan:    End of life planning; Advance aging; Advanced directives discussed. Copy of current HCPOA/Living Will requested.    During the course of the visit, Renee Vazquez was educated and counseled about the following appropriate screening and preventive services:   Vaccines to include Pneumoccal, Influenza, Hepatitis B, Td, Zostavax, HCV  Electrocardiogram  Cardiovascular disease screening  Colorectal cancer screening  Bone density screening  Diabetes screening  Glaucoma screening  Mammography/PAP  Nutrition counseling  Smoking cessation counseling  Patient  Instructions (the written plan) were given to the patient.    Varney Biles, LPN   [id number]    Reviewed above information.  Agree with plan.   Dr Nicki Reaper

## 2015-12-18 ENCOUNTER — Ambulatory Visit: Payer: Medicare Other | Admitting: Internal Medicine

## 2016-03-15 ENCOUNTER — Telehealth: Payer: Self-pay | Admitting: *Deleted

## 2016-03-15 DIAGNOSIS — M8580 Other specified disorders of bone density and structure, unspecified site: Secondary | ICD-10-CM

## 2016-03-15 DIAGNOSIS — E7800 Pure hypercholesterolemia, unspecified: Secondary | ICD-10-CM

## 2016-03-15 NOTE — Telephone Encounter (Signed)
Pt coming in for labs on Monday. Need orders

## 2016-03-16 NOTE — Telephone Encounter (Signed)
Orders placed for labs

## 2016-03-18 ENCOUNTER — Encounter: Payer: Self-pay | Admitting: Internal Medicine

## 2016-03-18 ENCOUNTER — Other Ambulatory Visit (INDEPENDENT_AMBULATORY_CARE_PROVIDER_SITE_OTHER): Payer: Medicare Other

## 2016-03-18 DIAGNOSIS — E7800 Pure hypercholesterolemia, unspecified: Secondary | ICD-10-CM

## 2016-03-18 DIAGNOSIS — M8580 Other specified disorders of bone density and structure, unspecified site: Secondary | ICD-10-CM

## 2016-03-18 LAB — LIPID PANEL
CHOL/HDL RATIO: 3
Cholesterol: 195 mg/dL (ref 0–200)
HDL: 69.3 mg/dL (ref >39.00)
LDL Cholesterol: 104 mg/dL — ABNORMAL HIGH (ref 0–99)
NonHDL: 125.64
TRIGLYCERIDES: 108 mg/dL (ref 0.0–149.0)
VLDL: 21.6 mg/dL (ref 0.0–40.0)

## 2016-03-18 LAB — HEPATIC FUNCTION PANEL
ALBUMIN: 3.6 g/dL (ref 3.5–5.2)
ALK PHOS: 57 U/L (ref 39–117)
ALT: 13 U/L (ref 0–35)
AST: 20 U/L (ref 0–37)
BILIRUBIN DIRECT: 0.1 mg/dL (ref 0.0–0.3)
TOTAL PROTEIN: 7.3 g/dL (ref 6.0–8.3)
Total Bilirubin: 0.8 mg/dL (ref 0.2–1.2)

## 2016-03-18 LAB — BASIC METABOLIC PANEL
BUN: 19 mg/dL (ref 6–23)
CHLORIDE: 105 meq/L (ref 96–112)
CO2: 27 meq/L (ref 19–32)
Calcium: 8.7 mg/dL (ref 8.4–10.5)
Creatinine, Ser: 0.94 mg/dL (ref 0.40–1.20)
GFR: 61.79 mL/min (ref >60.00)
GLUCOSE: 85 mg/dL (ref 70–99)
POTASSIUM: 4.2 meq/L (ref 3.5–5.1)
Sodium: 140 mEq/L (ref 135–145)

## 2016-03-18 LAB — TSH: TSH: 3.96 u[IU]/mL (ref 0.35–4.50)

## 2016-03-18 LAB — VITAMIN D 25 HYDROXY (VIT D DEFICIENCY, FRACTURES): VITD: 44.63 ng/mL (ref 30.00–100.00)

## 2016-03-20 ENCOUNTER — Ambulatory Visit: Payer: Medicare Other | Admitting: Internal Medicine

## 2016-04-04 ENCOUNTER — Encounter: Payer: Self-pay | Admitting: Internal Medicine

## 2016-04-04 ENCOUNTER — Ambulatory Visit (INDEPENDENT_AMBULATORY_CARE_PROVIDER_SITE_OTHER): Payer: Medicare Other | Admitting: Internal Medicine

## 2016-04-04 DIAGNOSIS — Z853 Personal history of malignant neoplasm of breast: Secondary | ICD-10-CM

## 2016-04-04 DIAGNOSIS — E7800 Pure hypercholesterolemia, unspecified: Secondary | ICD-10-CM

## 2016-04-04 MED ORDER — SCOPOLAMINE 1 MG/3DAYS TD PT72: 1.0000 | MEDICATED_PATCH | TRANSDERMAL | 0 refills | Status: DC

## 2016-04-04 NOTE — Progress Notes (Signed)
Patient ID: Renee Vazquez, female   DOB: 1941/04/14, 75 y.o.   MRN: [id number]   Subjective:    Patient ID: Renee Vazquez, female    DOB: Oct 26, 1940, 75 y.o.   MRN: [id number]  HPI  Patient here for a scheduled follow up.  States she feels good.  Stays active.  Dances.  No chest pain.  No sob.  No acid reflux.  No abdominal pain or cramping.  Bowels stable.  No urine change.     Past Medical History:  Diagnosis Date  . Breast cancer ([place])    Dr Ewell Poe  . Hypercholesterolemia   . Osteopenia    Past Surgical History:  Procedure Laterality Date  . BREAST LUMPECTOMY  2007  . EYE SURGERY     detached retina   Family History  Problem Relation Age of Onset  . Hypertension Mother   . Hypercholesterolemia Mother   . Prostate cancer Father   . Breast cancer Maternal Aunt   . Melanoma Sister   . Colon cancer Neg Hx    Social History   Social History  . Marital status: Divorced    Spouse name: N/A  . Number of children: 4  . Years of education: N/A   Occupational History  . retired    Social History Main Topics  . Smoking status: Never Smoker  . Smokeless tobacco: Never Used  . Alcohol use 0.0 oz/week     Comment: occasional  . Drug use: No  . Sexual activity: No   Other Topics Concern  . None   Social History Narrative  . None    Outpatient Encounter Prescriptions as of 04/04/2016  Medication Sig  . calcium citrate-vitamin D 200-200 MG-UNIT TABS Take 1 tablet by mouth daily.  Marland Kitchen MELATONIN PO Take by mouth at bedtime as needed (sleep).  . Multiple Vitamins-Minerals (CENTRUM SILVER ADULT 50+ PO) Take by mouth.  Marland Kitchen scopolamine (TRANSDERM-SCOP, 1.5 MG,) 1 MG/3DAYS Place 1 patch (1.5 mg total) onto the skin every 3 (three) days. Hold refill until pt request.   No facility-administered encounter medications on file as of 04/04/2016.     Review of Systems  Constitutional: Negative for appetite change and unexpected weight change.  HENT:  Negative for congestion and sinus pressure.   Respiratory: Negative for cough, chest tightness and shortness of breath.   Cardiovascular: Negative for chest pain, palpitations and leg swelling.  Gastrointestinal: Negative for abdominal pain, diarrhea, nausea and vomiting.  Genitourinary: Negative for difficulty urinating and dysuria.  Musculoskeletal: Negative for back pain and joint swelling.  Skin: Negative for color change and rash.  Neurological: Negative for dizziness, light-headedness and headaches.  Psychiatric/Behavioral: Negative for agitation and dysphoric mood.       Objective:     Blood pressure rechecked by me:  134/80  Physical Exam  Constitutional: She appears well-developed and well-nourished. No distress.  HENT:  Nose: Nose normal.  Mouth/Throat: Oropharynx is clear and moist.  Neck: Neck supple. No thyromegaly present.  Cardiovascular: Normal rate and regular rhythm.   Pulmonary/Chest: Breath sounds normal. No respiratory distress. She has no wheezes.  Abdominal: Soft. Bowel sounds are normal. There is no tenderness.  Musculoskeletal: She exhibits no edema or tenderness.  Lymphadenopathy:    She has no cervical adenopathy.  Skin: No rash noted. No erythema.  Psychiatric: She has a normal mood and affect. Her behavior is normal.    BP (!) 150/70   Pulse 78   Temp 98.9 F (37.2  C) (Oral)   Ht 5\' 2"  (1.575 m)   Wt 141 lb (64 kg)   SpO2 98%   BMI 25.79 kg/m  Wt Readings from Last 3 Encounters:  04/04/16 141 lb (64 kg)  09/18/15 140 lb 12.8 oz (63.9 kg)  09/13/15 141 lb 2 oz (64 kg)     Lab Results  Component Value Date   WBC 5.1 09/11/2015   HGB 14.3 09/11/2015   HCT 42.6 09/11/2015   PLT 264.0 09/11/2015   GLUCOSE 85 03/18/2016   CHOL 195 03/18/2016   TRIG 108.0 03/18/2016   HDL 69.30 03/18/2016   LDLDIRECT 152.4 08/16/2013   LDLCALC 104 (H) 03/18/2016   ALT 13 03/18/2016   AST 20 03/18/2016   NA 140 03/18/2016   K 4.2 03/18/2016   CL 105  03/18/2016   CREATININE 0.94 03/18/2016   BUN 19 03/18/2016   CO2 27 03/18/2016   TSH 3.96 03/18/2016       Assessment & Plan:   Problem List Items Addressed This Visit    History of breast cancer    Previous nipple discharge.  Was evaluated at [facility].  See notes.  Recommended continuing routine mammograms.  States due in December.        Relevant Orders   MM Digital Diagnostic Bilat   CBC with Differential/Platelet   Hypercholesterolemia    Low cholesterol diet and exercise.  Follow lipid panel.   Lab Results  Component Value Date   CHOL 195 03/18/2016   HDL 69.30 03/18/2016   LDLCALC 104 (H) 03/18/2016   LDLDIRECT 152.4 08/16/2013   TRIG 108.0 03/18/2016   CHOLHDL 3 03/18/2016        Relevant Orders   Basic metabolic panel   Hepatic function panel   Lipid panel    Other Visit Diagnoses   None.      Einar Pheasant, MD

## 2016-04-04 NOTE — Progress Notes (Signed)
Pre visit review using our clinic review tool, if applicable. No additional management support is needed unless otherwise documented below in the visit note. 

## 2016-04-07 ENCOUNTER — Encounter: Payer: Self-pay | Admitting: Internal Medicine

## 2016-04-07 NOTE — Assessment & Plan Note (Signed)
Low cholesterol diet and exercise.  Follow lipid panel.   Lab Results  Component Value Date   CHOL 195 03/18/2016   HDL 69.30 03/18/2016   LDLCALC 104 (H) 03/18/2016   LDLDIRECT 152.4 08/16/2013   TRIG 108.0 03/18/2016   CHOLHDL 3 03/18/2016

## 2016-04-07 NOTE — Assessment & Plan Note (Addendum)
Previous nipple discharge.  Was evaluated at [facility].  See notes.  Recommended continuing routine mammograms.  States due in December.

## 2016-04-09 ENCOUNTER — Encounter: Payer: Self-pay | Admitting: Internal Medicine

## 2016-04-09 NOTE — Telephone Encounter (Signed)
Please call pt and find out how long symptoms have been going on.  She was just evaluated and no problems.  If nasal congestion, can try saline nasal spray - flush nose at least 2-3x/day.  Also try nasacort nasal spray - 2 sprays each nostril one time per day.  Robitussin twice a day as needed.  If persistent symptoms, will need to be evaluation.  (would avoid abx unless know for sure needed).  If needs appt, let me know.

## 2016-07-17 ENCOUNTER — Encounter: Payer: Self-pay | Admitting: Internal Medicine

## 2016-07-17 MED ORDER — SCOPOLAMINE 1 MG/3DAYS TD PT72: 1.0000 | MEDICATED_PATCH | TRANSDERMAL | 0 refills | Status: DC

## 2016-07-17 NOTE — Telephone Encounter (Signed)
Pt called back and stated that the pharmacy told her she needs a Pa. Please advise, thank you!  Call pt @ [phone]

## 2016-07-17 NOTE — Telephone Encounter (Signed)
rx sent in for scopolamine patches.  She got the previously prior to a trip (must have paid for them) - so no need for PA.  Please notify pt rx sent in.  Thanks

## 2016-07-17 NOTE — Telephone Encounter (Signed)
I can try the PA but this is a medication not deemed medically neccessary Patient will probably have to pay out of pocket.

## 2016-07-22 ENCOUNTER — Telehealth: Payer: Self-pay | Admitting: Internal Medicine

## 2016-07-22 MED ORDER — SCOPOLAMINE 1 MG/3DAYS TD PT72: 1.0000 | MEDICATED_PATCH | TRANSDERMAL | 0 refills | Status: DC

## 2016-07-22 NOTE — Telephone Encounter (Signed)
A PA was done over the phone and was approved until 06/23/17. Reference PA number is [id number].

## 2016-07-22 NOTE — Telephone Encounter (Signed)
Pt requested to have a paper Rx, to try other drug store options.  Pt contact [phone]

## 2016-07-22 NOTE — Telephone Encounter (Signed)
Patient notified pa started have not received approval or denial . Gave script to patient along with some drug cards to pick up if we do not receive approval to day.

## 2016-07-23 NOTE — Telephone Encounter (Signed)
Approval letter received faxed to pharmacy

## 2016-09-17 ENCOUNTER — Ambulatory Visit (INDEPENDENT_AMBULATORY_CARE_PROVIDER_SITE_OTHER): Payer: Medicare Other

## 2016-09-17 VITALS — BP 136/72 | HR 72 | Temp 97.7°F | Resp 12 | Ht 62.0 in | Wt 139.1 lb

## 2016-09-17 DIAGNOSIS — Z0000 Encounter for general adult medical examination without abnormal findings: Secondary | ICD-10-CM | POA: Diagnosis not present

## 2016-09-17 NOTE — Patient Instructions (Addendum)
  Ms. Nonaka , Thank you for taking time to come for your Medicare Wellness Visit. I appreciate your ongoing commitment to your health goals. Please review the following plan we discussed and let me know if I can assist you in the future.   Follow up with Dr. Nicki Reaper as needed.    Bring a copy of your [place] and/or Living Will to be scanned into chart.  Have a great day!  These are the goals we discussed: Goals    . Healthy Lifestyle          Maintain exercise regimen of daily dancing. Stay hydrated and drink plenty of water. Low carb foods.  Lean meats, fruits and vegetables.       This is a list of the screening recommended for you and due dates:  Health Maintenance  Topic Date Due  . DEXA scan (bone density measurement)  09/17/2016*  . Mammogram  05/29/2017  . Colon Cancer Screening  09/08/2023  . Tetanus Vaccine  12/01/2024  . Flu Shot  Completed  . Pneumonia vaccines  Completed  *Topic was postponed. The date shown is not the original due date.

## 2016-09-17 NOTE — Progress Notes (Signed)
Subjective:   Renee Vazquez is a 76 y.o. female who presents for Medicare Annual (Subsequent) preventive examination.  Review of Systems:  No ROS.  Medicare Wellness Visit.  Cardiac Risk Factors include: advanced age (>40men, >63 women)     Objective:     Vitals: BP 136/72 (BP Location: Left Arm, Patient Position: Sitting, Cuff Size: Normal)   Pulse 72   Temp 97.7 F (36.5 C) (Oral)   Resp 12   Ht 5\' 2"  (1.575 m)   Wt 139 lb 1.9 oz (63.1 kg)   SpO2 98%   BMI 25.45 kg/m   Body mass index is 25.45 kg/m.   Tobacco History  Smoking Status  . Never Smoker  Smokeless Tobacco  . Never Used     Counseling given: Not Answered   Past Medical History:  Diagnosis Date  . Breast cancer ([place])    Dr Ewell Poe  . Hypercholesterolemia   . Osteopenia    Past Surgical History:  Procedure Laterality Date  . BREAST LUMPECTOMY  2007  . EYE SURGERY     detached retina   Family History  Problem Relation Age of Onset  . Hypertension Mother   . Hypercholesterolemia Mother   . Prostate cancer Father   . Breast cancer Maternal Aunt   . Melanoma Sister   . Vesicoureteral reflux Daughter   . Colon cancer Neg Hx    History  Sexual Activity  . Sexual activity: No    Outpatient Encounter Prescriptions as of 09/17/2016  Medication Sig  . calcium citrate-vitamin D 200-200 MG-UNIT TABS Take 1 tablet by mouth daily.  Marland Kitchen MELATONIN PO Take by mouth at bedtime as needed (sleep).  . Multiple Vitamins-Minerals (CENTRUM SILVER ADULT 50+ PO) Take by mouth.  Marland Kitchen scopolamine (TRANSDERM-SCOP, 1.5 MG,) 1 MG/3DAYS Place 1 patch (1.5 mg total) onto the skin every 3 (three) days. Hold refill until pt request.   No facility-administered encounter medications on file as of 09/17/2016.     Activities of Daily Living In your present state of health, do you have any difficulty performing the following activities: 09/17/2016 09/18/2015  Hearing? N N  Vision? N N  Difficulty  concentrating or making decisions? N N  Walking or climbing stairs? N N  Dressing or bathing? N N  Doing errands, shopping? N N  Preparing Food and eating ? N N  Using the Toilet? N N  In the past six months, have you accidently leaked urine? N N  Do you have problems with loss of bowel control? N N  Managing your Medications? N N  Managing your Finances? N N  Housekeeping or managing your Housekeeping? N N  Some recent data might be hidden    Patient Care Team: Einar Pheasant, MD as PCP - General (Internal Medicine)    Assessment:    This is a routine wellness examination for [place]. The goal of the wellness visit is to assist the patient how to close the gaps in care and create a preventative care plan for the patient.   Taking calcium VIT D as appropriate/Osteoporosis risk reviewed.  Medications reviewed; taking without issues or barriers.  Safety issues reviewed; smoke detectors in the home. No firearms in the home.  Wears seatbelts when driving or riding with others. Patient does wear sunscreen or protective clothing when in direct sunlight. No violence in the home.  Patient is alert, normal appearance, oriented to person/place/and time. Correctly identified the president of the [place], recall of  3/3 objects, and performing simple calculations.  Patient displays appropriate judgement and can read correct time from watch face.  No new identified risk were noted.  No failures at ADL's or IADL's.   BMI- discussed the importance of a healthy diet, water intake and exercise. Educational material provided.   HTN- followed by PCP.  Dental- every six months. Dr. Bradly Chris.  Eye- Visual acuity not assessed per patient preference since they have regular follow up with the ophthalmologist.  Wears corrective lenses.  Sleep patterns- Sleeps 7-8 hours at night.  Wakes feeling rested.  Health maintenance gaps- closed.  Patient Concerns: None at this time. Follow up with PCP  as needed.  Exercise Activities and Dietary recommendations Current Exercise Habits: Home exercise routine, Type of exercise: calisthenics (Dancing), Time (Minutes): 60, Frequency (Times/Week): 5, Weekly Exercise (Minutes/Week): 300, Intensity: Moderate  Goals    . Healthy Lifestyle          Maintain exercise regimen of daily dancing. Stay hydrated and drink plenty of water. Low carb foods.  Lean meats, fruits and vegetables.      Fall Risk Fall Risk  09/17/2016 04/04/2016 09/18/2015 09/13/2015 08/26/2014  Falls in the past year? No No No No No   Depression Screen PHQ 2/9 Scores 09/17/2016 04/04/2016 09/18/2015 09/13/2015  PHQ - 2 Score 0 0 0 0     Cognitive Function MMSE - Mini Mental State Exam 09/17/2016 09/18/2015  Orientation to time 5 5  Orientation to Place 5 5  Registration 3 3  Attention/ Calculation 5 5  Recall 3 3  Language- name 2 objects 2 2  Language- repeat 1 1  Language- follow 3 step command 3 3  Language- read & follow direction 1 1  Write a sentence 1 1  Copy design 1 1  Total score 30 30        Immunization History  Administered Date(s) Administered  . Influenza Split 04/15/2014  . Influenza-Unspecified 04/06/2015, 04/22/2016  . Pneumococcal Conjugate-13 03/11/2014  . Pneumococcal Polysaccharide-23 09/13/2015  . Tdap 12/02/2014  . Zoster 08/17/2012   Screening Tests Health Maintenance  Topic Date Due  . DEXA SCAN  09/17/2016 (Originally 09/27/2006)  . MAMMOGRAM  05/29/2017  . COLONOSCOPY  09/08/2023  . TETANUS/TDAP  12/01/2024  . INFLUENZA VACCINE  Completed  . PNA vac Low Risk Adult  Completed      Plan:    End of life planning; Advance aging; Advanced directives discussed. Copy of current HCPOA/Living Will requested.    Medicare Attestation I have personally reviewed: The patient's medical and social history Their use of alcohol, tobacco or illicit drugs Their current medications and supplements The patient's functional ability including  ADLs,fall risks, home safety risks, cognitive, and hearing and visual impairment Diet and physical activities Evidence for depression   The patient's weight, height, BMI, and visual acuity have been recorded in the chart.  I have made referrals and provided education to the patient based on review of the above and I have provided the patient with a written personalized care plan for preventive services.    During the course of the visit the patient was educated and counseled about the following appropriate screening and preventive services:   Vaccines to include Pneumoccal, Influenza, Hepatitis B, Td, Zostavax, HCV  Colorectal cancer screening-UTD  Bone density screening-UTD  Glaucoma screening-annual eye exam  Mammography-UTD  Nutrition counseling   Patient Instructions (the written plan) was given to the patient.   Varney Biles, LPN  09/30/8117  Reviewed above information.  Agree with plan.  Dr Nicki Reaper

## 2016-09-24 ENCOUNTER — Other Ambulatory Visit (INDEPENDENT_AMBULATORY_CARE_PROVIDER_SITE_OTHER): Payer: Medicare Other

## 2016-09-24 DIAGNOSIS — E7800 Pure hypercholesterolemia, unspecified: Secondary | ICD-10-CM

## 2016-09-24 DIAGNOSIS — Z853 Personal history of malignant neoplasm of breast: Secondary | ICD-10-CM

## 2016-09-24 LAB — CBC WITH DIFFERENTIAL/PLATELET
BASOS ABS: 0 10*3/uL (ref 0.0–0.1)
Basophils Relative: 1 % (ref 0.0–3.0)
EOS ABS: 0.2 10*3/uL (ref 0.0–0.7)
Eosinophils Relative: 3.2 % (ref 0.0–5.0)
HEMATOCRIT: 40.5 % (ref 36.0–46.0)
HEMOGLOBIN: 13.7 g/dL (ref 12.0–15.0)
LYMPHS PCT: 37.9 % (ref 12.0–46.0)
Lymphs Abs: 1.8 10*3/uL (ref 0.7–4.0)
MCHC: 33.8 g/dL (ref 30.0–36.0)
MCV: 95.1 fl (ref 78.0–100.0)
MONO ABS: 0.5 10*3/uL (ref 0.1–1.0)
Monocytes Relative: 10.9 % (ref 3.0–12.0)
Neutro Abs: 2.2 10*3/uL (ref 1.4–7.7)
Neutrophils Relative %: 47 % (ref 43.0–77.0)
Platelets: 249 10*3/uL (ref 150.0–400.0)
RBC: 4.26 Mil/uL (ref 3.87–5.11)
RDW: 13.6 % (ref 11.5–15.5)
WBC: 4.7 10*3/uL (ref 4.0–10.5)

## 2016-09-24 LAB — HEPATIC FUNCTION PANEL
ALBUMIN: 3.7 g/dL (ref 3.5–5.2)
ALK PHOS: 56 U/L (ref 39–117)
ALT: 12 U/L (ref 0–35)
AST: 17 U/L (ref 0–37)
BILIRUBIN TOTAL: 0.6 mg/dL (ref 0.2–1.2)
Bilirubin, Direct: 0.1 mg/dL (ref 0.0–0.3)
Total Protein: 7 g/dL (ref 6.0–8.3)

## 2016-09-24 LAB — LIPID PANEL
CHOL/HDL RATIO: 3
CHOLESTEROL: 221 mg/dL — AB (ref 0–200)
HDL: 63.1 mg/dL (ref >39.00)
LDL CALC: 128 mg/dL — AB (ref 0–99)
NONHDL: 157.67
Triglycerides: 146 mg/dL (ref 0.0–149.0)
VLDL: 29.2 mg/dL (ref 0.0–40.0)

## 2016-09-24 LAB — BASIC METABOLIC PANEL
BUN: 17 mg/dL (ref 6–23)
CHLORIDE: 105 meq/L (ref 96–112)
CO2: 29 mEq/L (ref 19–32)
Calcium: 9.1 mg/dL (ref 8.4–10.5)
Creatinine, Ser: 0.88 mg/dL (ref 0.40–1.20)
GFR: 66.58 mL/min (ref >60.00)
GLUCOSE: 91 mg/dL (ref 70–99)
POTASSIUM: 4.7 meq/L (ref 3.5–5.1)
SODIUM: 138 meq/L (ref 135–145)

## 2016-09-25 ENCOUNTER — Encounter: Payer: Self-pay | Admitting: Internal Medicine

## 2016-09-27 ENCOUNTER — Encounter: Payer: Self-pay | Admitting: Internal Medicine

## 2016-09-27 ENCOUNTER — Ambulatory Visit (INDEPENDENT_AMBULATORY_CARE_PROVIDER_SITE_OTHER): Payer: Medicare Other | Admitting: Internal Medicine

## 2016-09-27 VITALS — BP 136/74 | HR 73 | Temp 97.8°F | Resp 14 | Ht 61.42 in | Wt 141.0 lb

## 2016-09-27 DIAGNOSIS — Z853 Personal history of malignant neoplasm of breast: Secondary | ICD-10-CM | POA: Diagnosis not present

## 2016-09-27 DIAGNOSIS — E7800 Pure hypercholesterolemia, unspecified: Secondary | ICD-10-CM

## 2016-09-27 DIAGNOSIS — Z0000 Encounter for general adult medical examination without abnormal findings: Secondary | ICD-10-CM | POA: Diagnosis not present

## 2016-09-27 DIAGNOSIS — M8580 Other specified disorders of bone density and structure, unspecified site: Secondary | ICD-10-CM | POA: Diagnosis not present

## 2016-09-27 NOTE — Progress Notes (Signed)
Patient ID: Renee Vazquez, female   DOB: 04/29/1941, 76 y.o.   MRN: [id number]   Subjective:    Patient ID: Renee Vazquez, female    DOB: [birth year], 76 y.o.   MRN: [id number]  HPI  Patient here for her physical exam.  She is doing well.  Stays active.  Line dances.  No chest pain.  No sob.  No acid reflux.  No abdominal pain.  Bowels moving.  Had mammogram 06/03/16 - Birads II.  States was out of her routine prior to her labs.  Ate differently and was not exercising as much.  Feels this is why her LDL increased slightly.  Will get back in her routine.     Past Medical History:  Diagnosis Date  . Breast cancer ([place])    Dr Ewell Poe  . Hypercholesterolemia   . Osteopenia    Past Surgical History:  Procedure Laterality Date  . BREAST LUMPECTOMY  2007  . EYE SURGERY     detached retina   Family History  Problem Relation Age of Onset  . Hypertension Mother   . Hypercholesterolemia Mother   . Prostate cancer Father   . Breast cancer Maternal Aunt   . Melanoma Sister   . Vesicoureteral reflux Daughter   . Colon cancer Neg Hx    Social History   Social History  . Marital status: Divorced    Spouse name: N/A  . Number of children: 4  . Years of education: N/A   Occupational History  . retired    Social History Main Topics  . Smoking status: Never Smoker  . Smokeless tobacco: Never Used  . Alcohol use 0.0 oz/week     Comment: occasional  . Drug use: No  . Sexual activity: No   Other Topics Concern  . None   Social History Narrative  . None    Outpatient Encounter Prescriptions as of 09/27/2016  Medication Sig  . calcium citrate-vitamin D 200-200 MG-UNIT TABS Take 1 tablet by mouth daily.  . cetirizine (ZYRTEC) 5 MG tablet Take 5 mg by mouth daily.  Marland Kitchen MELATONIN PO Take by mouth at bedtime as needed (sleep).  . Multiple Vitamins-Minerals (CENTRUM SILVER ADULT 50+ PO) Take by mouth.  Marland Kitchen scopolamine (TRANSDERM-SCOP, 1.5 MG,) 1 MG/3DAYS Place 1  patch (1.5 mg total) onto the skin every 3 (three) days. Hold refill until pt request. (Patient not taking: Reported on 09/27/2016)   No facility-administered encounter medications on file as of 09/27/2016.     Review of Systems  Constitutional: Negative for appetite change and unexpected weight change.  HENT: Negative for congestion and sinus pressure.   Eyes: Negative for pain and visual disturbance.  Respiratory: Negative for cough, chest tightness and shortness of breath.   Cardiovascular: Negative for chest pain, palpitations and leg swelling.  Gastrointestinal: Negative for abdominal pain, diarrhea, nausea and vomiting.  Genitourinary: Negative for difficulty urinating and dysuria.  Musculoskeletal: Negative for back pain and joint swelling.  Skin: Negative for color change and rash.  Neurological: Negative for dizziness, light-headedness and headaches.  Hematological: Negative for adenopathy. Does not bruise/bleed easily.  Psychiatric/Behavioral: Negative for agitation and dysphoric mood.       Objective:    Physical Exam  Constitutional: She appears well-developed and well-nourished. No distress.  HENT:  Nose: Nose normal.  Mouth/Throat: Oropharynx is clear and moist.  Eyes: Conjunctivae are normal. Right eye exhibits no discharge. Left eye exhibits no discharge.  Neck: Neck supple. No thyromegaly present.  Cardiovascular: Normal rate and regular rhythm.   Pulmonary/Chest: Breath sounds normal. No respiratory distress. She has no wheezes.  Breast exam - right breast well healed incision site.  No nipple discharge or nipple retraction present.  Could not appreciate any adnexal masses or tenderness.  Left breast - nipple inverted (unchanged).  No palpable nodules or axillary nodules.    Abdominal: Soft. Bowel sounds are normal. There is no tenderness.  Musculoskeletal: She exhibits no edema or tenderness.  Lymphadenopathy:    She has no cervical adenopathy.  Skin: No rash  noted. No erythema.  Psychiatric: She has a normal mood and affect. Her behavior is normal.    BP 136/74 (BP Location: Left Arm, Patient Position: Sitting, Cuff Size: Normal)   Pulse 73   Temp 97.8 F (36.6 C) (Oral)   Resp 14   Ht 5' 1.42" (1.56 m)   Wt 141 lb (64 kg)   SpO2 98%   BMI 26.28 kg/m  Wt Readings from Last 3 Encounters:  09/27/16 141 lb (64 kg)  09/17/16 139 lb 1.9 oz (63.1 kg)  04/04/16 141 lb (64 kg)     Lab Results  Component Value Date   WBC 4.7 09/24/2016   HGB 13.7 09/24/2016   HCT 40.5 09/24/2016   PLT 249.0 09/24/2016   GLUCOSE 91 09/24/2016   CHOL 221 (H) 09/24/2016   TRIG 146.0 09/24/2016   HDL 63.10 09/24/2016   LDLDIRECT 152.4 08/16/2013   LDLCALC 128 (H) 09/24/2016   ALT 12 09/24/2016   AST 17 09/24/2016   NA 138 09/24/2016   K 4.7 09/24/2016   CL 105 09/24/2016   CREATININE 0.88 09/24/2016   BUN 17 09/24/2016   CO2 29 09/24/2016   TSH 3.96 03/18/2016        Assessment & Plan:   Problem List Items Addressed This Visit    Health care maintenance    Physical today 09/27/16.  Mammogram 06/03/16 - Birads II.  Colonoscopy 09/07/13 - recommended f/u colonoscopy in 10 years.        History of breast cancer    Mammogram 06/03/16 - Birads II.        Hypercholesterolemia    LDL increased some when compared to previous check.  She wants to get back in her routine of diet and exercise.  Follow lipid panel.        Relevant Orders   TSH   Lipid panel   Hepatic function panel   Basic metabolic panel   Osteopenia    Continue weight bearing exercise.  She wants to think about f/u bone density.  Will let me know.         Other Visit Diagnoses    Routine general medical examination at a health care facility    -  Primary       Einar Pheasant, MD

## 2016-09-27 NOTE — Assessment & Plan Note (Signed)
Continue weight bearing exercise.  She wants to think about f/u bone density.  Will let me know.

## 2016-09-27 NOTE — Assessment & Plan Note (Signed)
LDL increased some when compared to previous check.  She wants to get back in her routine of diet and exercise.  Follow lipid panel.

## 2016-09-27 NOTE — Progress Notes (Signed)
Pre-visit discussion using our clinic review tool. No additional management support is needed unless otherwise documented below in the visit note.  

## 2016-09-27 NOTE — Assessment & Plan Note (Signed)
Mammogram 06/03/16 - Birads II.

## 2016-09-27 NOTE — Assessment & Plan Note (Signed)
Physical today 09/27/16.  Mammogram 06/03/16 - Birads II.  Colonoscopy 09/07/13 - recommended f/u colonoscopy in 10 years.

## 2016-09-29 ENCOUNTER — Encounter: Payer: Self-pay | Admitting: Internal Medicine

## 2017-04-02 ENCOUNTER — Other Ambulatory Visit (INDEPENDENT_AMBULATORY_CARE_PROVIDER_SITE_OTHER): Payer: Medicare Other

## 2017-04-02 DIAGNOSIS — E7800 Pure hypercholesterolemia, unspecified: Secondary | ICD-10-CM

## 2017-04-02 LAB — BASIC METABOLIC PANEL
BUN: 15 mg/dL (ref 6–23)
CALCIUM: 9.2 mg/dL (ref 8.4–10.5)
CO2: 27 mEq/L (ref 19–32)
Chloride: 104 mEq/L (ref 96–112)
Creatinine, Ser: 0.9 mg/dL (ref 0.40–1.20)
GFR: 64.79 mL/min (ref >60.00)
Glucose, Bld: 90 mg/dL (ref 70–99)
Potassium: 4.2 mEq/L (ref 3.5–5.1)
Sodium: 138 mEq/L (ref 135–145)

## 2017-04-02 LAB — LIPID PANEL
Cholesterol: 197 mg/dL (ref 0–200)
HDL: 64.3 mg/dL (ref >39.00)
LDL CALC: 115 mg/dL — AB (ref 0–99)
NONHDL: 132.95
Total CHOL/HDL Ratio: 3
Triglycerides: 92 mg/dL (ref 0.0–149.0)
VLDL: 18.4 mg/dL (ref 0.0–40.0)

## 2017-04-02 LAB — HEPATIC FUNCTION PANEL
ALBUMIN: 3.7 g/dL (ref 3.5–5.2)
ALK PHOS: 57 U/L (ref 39–117)
ALT: 13 U/L (ref 0–35)
AST: 21 U/L (ref 0–37)
BILIRUBIN DIRECT: 0.1 mg/dL (ref 0.0–0.3)
TOTAL PROTEIN: 7.4 g/dL (ref 6.0–8.3)
Total Bilirubin: 0.7 mg/dL (ref 0.2–1.2)

## 2017-04-02 LAB — TSH: TSH: 4.37 u[IU]/mL (ref 0.35–4.50)

## 2017-04-04 ENCOUNTER — Ambulatory Visit: Payer: Medicare Other | Admitting: Internal Medicine

## 2017-04-07 ENCOUNTER — Telehealth: Payer: Self-pay | Admitting: Internal Medicine

## 2017-04-07 DIAGNOSIS — Z853 Personal history of malignant neoplasm of breast: Secondary | ICD-10-CM

## 2017-04-07 DIAGNOSIS — Z1239 Encounter for other screening for malignant neoplasm of breast: Secondary | ICD-10-CM

## 2017-04-07 NOTE — Telephone Encounter (Signed)
Called patient she has done at [facility] diagnostic mammogram. She would like done in the am after 9 any day. She would like done at [facility] only. Last done 05/27/16

## 2017-04-07 NOTE — Telephone Encounter (Signed)
Pt would like to get her mammo done December 4th. Order needed please and thank you!  Call pt @ [phone].

## 2017-04-07 NOTE — Telephone Encounter (Signed)
Pt called back returning your call. Please advise, thank you!  Call pt @ [phone]

## 2017-04-07 NOTE — Telephone Encounter (Signed)
Left message to return call to our office.  

## 2017-04-07 NOTE — Telephone Encounter (Signed)
I have placed the order for the mammogram.  I put in order to schedule at [facility].  Ok to schedule.

## 2017-04-08 NOTE — Telephone Encounter (Signed)
I don't have the contact number for this. Is this something you can do or give me the contact number to schedule?

## 2017-04-10 NOTE — Telephone Encounter (Signed)
No, I'll send the order over to [facility].

## 2017-04-10 NOTE — Telephone Encounter (Signed)
Thanks

## 2017-06-02 ENCOUNTER — Ambulatory Visit: Payer: Medicare Other | Admitting: Internal Medicine

## 2017-06-05 LAB — HM MAMMOGRAPHY

## 2017-06-09 ENCOUNTER — Encounter: Payer: Self-pay | Admitting: Internal Medicine

## 2017-06-09 ENCOUNTER — Ambulatory Visit: Payer: Medicare Other | Admitting: Internal Medicine

## 2017-06-09 VITALS — BP 110/70 | HR 73 | Temp 98.3°F | Ht 61.0 in | Wt 138.6 lb

## 2017-06-09 DIAGNOSIS — D649 Anemia, unspecified: Secondary | ICD-10-CM

## 2017-06-09 DIAGNOSIS — R0982 Postnasal drip: Secondary | ICD-10-CM

## 2017-06-09 DIAGNOSIS — Z853 Personal history of malignant neoplasm of breast: Secondary | ICD-10-CM

## 2017-06-09 DIAGNOSIS — E7800 Pure hypercholesterolemia, unspecified: Secondary | ICD-10-CM

## 2017-06-09 NOTE — Patient Instructions (Signed)
Saline nasal spray - flush nose at least 2-3x/day  nasacort nasal spray - 2 sprays each nostril one time per day.  Do this in the evening.    Robitussin DM twice a day as needed.  

## 2017-06-09 NOTE — Progress Notes (Signed)
Patient ID: Renee Vazquez, female   DOB: 03-29-1941, 76 y.o.   MRN: [id number]   Subjective:    Patient ID: Renee Vazquez, female    DOB: [birth year], 76 y.o.   MRN: [id number]  HPI  Patient here for a scheduled follow up.  She reports she is doing well.  Previously noticed some congestion.  Evaluated and diagnosed with sinus infection.  Treated with abx and prednisone.  Symptoms improved.  On no medication now.  Has some residual post nasal drainage.  No sore throat.  No chest congestion.  No sob.  No acid reflux.  No abdominal pain.  No chest pain.  Bowels moving.  No falls.     Past Medical History:  Diagnosis Date  . Breast cancer ([place])    Dr Ewell Poe  . Hypercholesterolemia   . Osteopenia    Past Surgical History:  Procedure Laterality Date  . BREAST LUMPECTOMY  2007  . EYE SURGERY     detached retina   Family History  Problem Relation Age of Onset  . Hypertension Mother   . Hypercholesterolemia Mother   . Prostate cancer Father   . Breast cancer Maternal Aunt   . Melanoma Sister   . Vesicoureteral reflux Daughter   . Colon cancer Neg Hx    Social History   Socioeconomic History  . Marital status: Divorced    Spouse name: None  . Number of children: 4  . Years of education: None  . Highest education level: None  Social Needs  . Financial resource strain: None  . Food insecurity - worry: None  . Food insecurity - inability: None  . Transportation needs - medical: None  . Transportation needs - non-medical: None  Occupational History  . Occupation: retired  Tobacco Use  . Smoking status: Never Smoker  . Smokeless tobacco: Never Used  Substance and Sexual Activity  . Alcohol use: Yes    Alcohol/week: 0.0 oz    Comment: occasional  . Drug use: No  . Sexual activity: No  Other Topics Concern  . None  Social History Narrative  . None    Outpatient Encounter Medications as of 06/09/2017  Medication Sig  . calcium citrate-vitamin D  200-200 MG-UNIT TABS Take 1 tablet by mouth daily.  . cetirizine (ZYRTEC) 5 MG tablet Take 5 mg by mouth daily.  Marland Kitchen MELATONIN PO Take by mouth at bedtime as needed (sleep).  . Multiple Vitamins-Minerals (CENTRUM SILVER ADULT 50+ PO) Take by mouth.  Marland Kitchen scopolamine (TRANSDERM-SCOP, 1.5 MG,) 1 MG/3DAYS Place 1 patch (1.5 mg total) onto the skin every 3 (three) days. Hold refill until pt request.   No facility-administered encounter medications on file as of 06/09/2017.     Review of Systems  Constitutional: Negative for appetite change and unexpected weight change.  HENT: Positive for congestion and postnasal drip. Negative for sinus pressure and sore throat.   Respiratory: Negative for cough, chest tightness and shortness of breath.   Cardiovascular: Negative for chest pain, palpitations and leg swelling.  Gastrointestinal: Negative for abdominal pain, diarrhea, nausea and vomiting.  Genitourinary: Negative for difficulty urinating and dysuria.  Musculoskeletal: Negative for joint swelling and myalgias.  Skin: Negative for color change and rash.  Neurological: Negative for dizziness, light-headedness and headaches.  Psychiatric/Behavioral: Negative for agitation and dysphoric mood.       Objective:    Physical Exam  Constitutional: She appears well-developed and well-nourished. No distress.  HENT:  Nose: Nose normal.  Mouth/Throat:  Oropharynx is clear and moist.  Neck: Neck supple. No thyromegaly present.  Cardiovascular: Normal rate and regular rhythm.  Pulmonary/Chest: Breath sounds normal. No respiratory distress. She has no wheezes.  Abdominal: Soft. Bowel sounds are normal. There is no tenderness.  Musculoskeletal: She exhibits no edema or tenderness.  Lymphadenopathy:    She has no cervical adenopathy.  Skin: No rash noted. No erythema.  Psychiatric: She has a normal mood and affect. Her behavior is normal.    BP 110/70   Pulse 73   Temp 98.3 F (36.8 C) (Oral)   Ht 5'  1" (1.549 m)   Wt 138 lb 9.6 oz (62.9 kg)   SpO2 97%   BMI 26.19 kg/m  Wt Readings from Last 3 Encounters:  06/09/17 138 lb 9.6 oz (62.9 kg)  09/27/16 141 lb (64 kg)  09/17/16 139 lb 1.9 oz (63.1 kg)     Lab Results  Component Value Date   WBC 4.7 09/24/2016   HGB 13.7 09/24/2016   HCT 40.5 09/24/2016   PLT 249.0 09/24/2016   GLUCOSE 90 04/02/2017   CHOL 197 04/02/2017   TRIG 92.0 04/02/2017   HDL 64.30 04/02/2017   LDLDIRECT 152.4 08/16/2013   LDLCALC 115 (H) 04/02/2017   ALT 13 04/02/2017   AST 21 04/02/2017   NA 138 04/02/2017   K 4.2 04/02/2017   CL 104 04/02/2017   CREATININE 0.90 04/02/2017   BUN 15 04/02/2017   CO2 27 04/02/2017   TSH 4.37 04/02/2017       Assessment & Plan:   Problem List Items Addressed This Visit    Anemia    Saw GI.  Had EGD and colonoscopy as outlined.  Recommended f/u colonoscopy in 10 years.  Follow cbc.       History of breast cancer    Mammogram 06/05/17 - Birads II.        Hypercholesterolemia    Low cholesterol diet and exercise.  Follow lipid panel.        Relevant Orders   Lipid panel   Hepatic function panel   Basic metabolic panel    Other Visit Diagnoses    Post-nasal drainage    -  Primary   previous congestion and symptoms improved.  with post nasal drainage.  nasacort nasal spray as directed.  notify me if persistent.         Einar Pheasant, MD

## 2017-06-11 ENCOUNTER — Encounter: Payer: Self-pay | Admitting: Internal Medicine

## 2017-06-11 NOTE — Assessment & Plan Note (Signed)
Saw GI.  Had EGD and colonoscopy as outlined.  Recommended f/u colonoscopy in 10 years.  Follow cbc.

## 2017-06-11 NOTE — Assessment & Plan Note (Signed)
Mammogram 06/05/17 - Birads II.

## 2017-06-11 NOTE — Assessment & Plan Note (Signed)
Low cholesterol diet and exercise.  Follow lipid panel.   

## 2017-07-17 ENCOUNTER — Encounter: Payer: Self-pay | Admitting: Internal Medicine

## 2017-09-18 ENCOUNTER — Ambulatory Visit (INDEPENDENT_AMBULATORY_CARE_PROVIDER_SITE_OTHER): Payer: Medicare Other

## 2017-09-18 VITALS — BP 110/70 | HR 70 | Temp 98.2°F | Resp 14 | Ht 61.0 in | Wt 138.8 lb

## 2017-09-18 DIAGNOSIS — Z0000 Encounter for general adult medical examination without abnormal findings: Secondary | ICD-10-CM

## 2017-09-18 NOTE — Progress Notes (Addendum)
Subjective:   Renee Vazquez is a 77 y.o. female who presents for Medicare Annual (Subsequent) preventive examination.  Review of Systems:  No ROS.  Medicare Wellness Visit. Additional risk factors are reflected in the social history.     Objective:     Vitals: BP 110/70 (BP Location: Left Arm, Patient Position: Sitting, Cuff Size: Normal)   Pulse 70   Temp 98.2 F (36.8 C) (Oral)   Resp 14   Ht 5\' 1"  (1.549 m)   Wt 138 lb 12.8 oz (63 kg)   SpO2 98%   BMI 26.23 kg/m   Body mass index is 26.23 kg/m.  Advanced Directives 09/18/2017 09/17/2016 09/18/2015  Does Patient Have a Medical Advance Directive? Yes Yes Yes  Type of Advance Directive Living will;Healthcare Power of Attorney Living will;Healthcare Power of [place];Living will  Does patient want to make changes to medical advance directive? No - Patient declined No - Patient declined -  Copy of [place] in Chart? No - copy requested No - copy requested No - copy requested    Tobacco Social History   Tobacco Use  Smoking Status Never Smoker  Smokeless Tobacco Never Used     Counseling given: Not Answered   Clinical Intake:  Pre-visit preparation completed: Yes  Pain : No/denies pain     Nutritional Status: BMI 25 -29 Overweight Diabetes: No  How often do you need to have someone help you when you read instructions, pamphlets, or other written materials from your doctor or pharmacy?: 1 - Never  Interpreter Needed?: No     Past Medical History:  Diagnosis Date  . Breast cancer ([place])    Dr Ewell Poe  . Hypercholesterolemia   . Osteopenia    Past Surgical History:  Procedure Laterality Date  . BREAST LUMPECTOMY  2007  . EYE SURGERY     detached retina   Family History  Problem Relation Age of Onset  . Hypertension Mother   . Hypercholesterolemia Mother   . Prostate cancer Father   . Breast cancer Maternal Aunt   . Melanoma  Sister   . Vesicoureteral reflux Daughter   . Colon cancer Neg Hx    Social History   Socioeconomic History  . Marital status: Divorced    Spouse name: Not on file  . Number of children: 4  . Years of education: Not on file  . Highest education level: Not on file  Occupational History  . Occupation: retired  Scientific laboratory technician  . Financial resource strain: Not hard at all  . Food insecurity:    Worry: Never true    Inability: Never true  . Transportation needs:    Medical: Not on file    Non-medical: Not on file  Tobacco Use  . Smoking status: Never Smoker  . Smokeless tobacco: Never Used  Substance and Sexual Activity  . Alcohol use: Yes    Alcohol/week: 0.0 oz    Comment: occasional  . Drug use: No  . Sexual activity: Never  Lifestyle  . Physical activity:    Days per week: 7 days    Minutes per session: 150+ min  . Stress: Not at all  Relationships  . Social connections:    Talks on phone: Not on file    Gets together: Not on file    Attends religious service: Not on file    Active member of club or organization: Not on file    Attends  meetings of clubs or organizations: Not on file    Relationship status: Not on file  Other Topics Concern  . Not on file  Social History Narrative  . Not on file    Outpatient Encounter Medications as of 09/18/2017  Medication Sig  . calcium citrate-vitamin D 200-200 MG-UNIT TABS Take 1 tablet by mouth daily.  . cetirizine (ZYRTEC) 5 MG tablet Take 5 mg by mouth daily.  Marland Kitchen MELATONIN PO Take by mouth at bedtime as needed (sleep).  . Multiple Vitamins-Minerals (CENTRUM SILVER ADULT 50+ PO) Take by mouth.  Marland Kitchen scopolamine (TRANSDERM-SCOP, 1.5 MG,) 1 MG/3DAYS Place 1 patch (1.5 mg total) onto the skin every 3 (three) days. Hold refill until pt request.   No facility-administered encounter medications on file as of 09/18/2017.     Activities of Daily Living In your present state of health, do you have any difficulty performing the  following activities: 09/18/2017  Hearing? N  Vision? N  Difficulty concentrating or making decisions? N  Walking or climbing stairs? N  Dressing or bathing? N  Doing errands, shopping? N  Preparing Food and eating ? N  Using the Toilet? N  In the past six months, have you accidently leaked urine? N  Do you have problems with loss of bowel control? N  Managing your Medications? N  Managing your Finances? N  Housekeeping or managing your Housekeeping? N  Some recent data might be hidden    Patient Care Team: Einar Pheasant, MD as PCP - General (Internal Medicine)    Assessment:   This is a routine wellness examination for [name]. The goal of the wellness visit is to assist the patient how to close the gaps in care and create a preventative care plan for the patient.   The roster of all physicians providing medical care to patient is listed in the Snapshot section of the chart.  Taking calcium VIT D as appropriate/Osteoporosis risk reviewed.  Dexa Scan discussed.  Deferred for follow up with pcp.   Safety issues reviewed; Smoke and carbon monoxide detectors in the home. No firearms in the home. Wears seatbelts when driving or riding with others. No violence in the home.  They do not have excessive sun exposure.  Discussed the need for sun protection: hats, long sleeves and the use of sunscreen if there is significant sun exposure.  Patient is alert, normal appearance, oriented to person/place/and time.  Correctly identified the president of the [place] and recalls of 3/3 words. Performs simple calculations and can read correct time from watch face. Displays appropriate judgement.  No new identified risk were noted.  No failures at ADL's or IADL's.    BMI- discussed the importance of a healthy diet, water intake and the benefits of aerobic exercise. Educational material provided.   24 hour diet recall: Low cholesterol/low carb diet  Daily fluid intake: 0 cups of caffeine,  6-8 cups of water  Dental- every 4 months.  Eye- Visual acuity not assessed per patient preference since they have regular follow up with the ophthalmologist.  Wears corrective lenses.  Sleep patterns- Sleeps 6-8 hours at night.  Wakes feeling rested.   Patient Concerns: None at this time. Follow up with PCP as needed.  Exercise Activities and Dietary recommendations Current Exercise Habits: Structured exercise class, Type of exercise: yoga;calisthenics(dancing), Time (Minutes): 60, Frequency (Times/Week): 7, Weekly Exercise (Minutes/Week): 420  Goals    . Weight (lb) < 138 lb (62.6 kg)     Exercise Stay hydrated Healthy  diet Portion control       Fall Risk Fall Risk  09/18/2017 09/17/2016 04/04/2016 09/18/2015 09/13/2015  Falls in the past year? No No No No No    Depression Screen PHQ 2/9 Scores 09/18/2017 09/17/2016 04/04/2016 09/18/2015  PHQ - 2 Score 0 0 0 0     Cognitive Function MMSE - Mini Mental State Exam 09/17/2016 09/18/2015  Orientation to time 5 5  Orientation to Place 5 5  Registration 3 3  Attention/ Calculation 5 5  Recall 3 3  Language- name 2 objects 2 2  Language- repeat 1 1  Language- follow 3 step command 3 3  Language- read & follow direction 1 1  Write a sentence 1 1  Copy design 1 1  Total score 30 30     6CIT Screen 09/18/2017  What Year? 0 points  What month? 0 points  What time? 0 points  Count back from 20 0 points  Months in reverse 0 points  Repeat phrase 0 points  Total Score 0    Immunization History  Administered Date(s) Administered  . Influenza Split 04/15/2014  . Influenza-Unspecified 04/06/2015, 04/22/2016, 03/24/2017  . Pneumococcal Conjugate-13 03/11/2014  . Pneumococcal Polysaccharide-23 09/13/2015  . Tdap 12/02/2014  . Zoster 08/17/2012   Screening Tests Health Maintenance  Topic Date Due  . DEXA SCAN  09/27/2006  . COLONOSCOPY  09/08/2023  . TETANUS/TDAP  12/01/2024  . INFLUENZA VACCINE  Completed  . PNA vac  Low Risk Adult  Completed      Plan:    End of life planning; Advance aging; Advanced directives discussed. Copy of current HCPOA/Living Will requested.    I have personally reviewed and noted the following in the patient's chart:   . Medical and social history . Use of alcohol, tobacco or illicit drugs  . Current medications and supplements . Functional ability and status . Nutritional status . Physical activity . Advanced directives . List of other physicians . Hospitalizations, surgeries, and ER visits in previous 12 months . Vitals . Screenings to include cognitive, depression, and falls . Referrals and appointments  In addition, I have reviewed and discussed with patient certain preventive protocols, quality metrics, and best practice recommendations. A written personalized care plan for preventive services as well as general preventive health recommendations were provided to patient.     Varney Biles, LPN  1/91/[id number]   Reviewed above information.  Agree with assessment and plan.    Dr Nicki Reaper

## 2017-09-18 NOTE — Patient Instructions (Addendum)
  Ms. Kunka , Thank you for taking time to come for your Medicare Wellness Visit. I appreciate your ongoing commitment to your health goals. Please review the following plan we discussed and let me know if I can assist you in the future.   These are the goals we discussed: Goals    . Weight (lb) < 138 lb (62.6 kg)     Exercise Stay hydrated Healthy diet Portion control       This is a list of the screening recommended for you and due dates:  Health Maintenance  Topic Date Due  . DEXA scan (bone density measurement)  09/27/2006  . Colon Cancer Screening  09/08/2023  . Tetanus Vaccine  12/01/2024  . Flu Shot  Completed  . Pneumonia vaccines  Completed

## 2017-11-10 ENCOUNTER — Other Ambulatory Visit (INDEPENDENT_AMBULATORY_CARE_PROVIDER_SITE_OTHER): Payer: Medicare Other

## 2017-11-10 DIAGNOSIS — E7800 Pure hypercholesterolemia, unspecified: Secondary | ICD-10-CM

## 2017-11-10 LAB — LIPID PANEL
Cholesterol: 173 mg/dL (ref 0–200)
HDL: 66.3 mg/dL (ref >39.00)
LDL Cholesterol: 89 mg/dL (ref 0–99)
NonHDL: 106.24
TRIGLYCERIDES: 86 mg/dL (ref 0.0–149.0)
Total CHOL/HDL Ratio: 3
VLDL: 17.2 mg/dL (ref 0.0–40.0)

## 2017-11-10 LAB — BASIC METABOLIC PANEL
BUN: 17 mg/dL (ref 6–23)
CHLORIDE: 107 meq/L (ref 96–112)
CO2: 27 meq/L (ref 19–32)
CREATININE: 0.9 mg/dL (ref 0.40–1.20)
Calcium: 9.3 mg/dL (ref 8.4–10.5)
GFR: 64.68 mL/min (ref >60.00)
Glucose, Bld: 93 mg/dL (ref 70–99)
Potassium: 4.4 mEq/L (ref 3.5–5.1)
Sodium: 140 mEq/L (ref 135–145)

## 2017-11-10 LAB — HEPATIC FUNCTION PANEL
ALT: 11 U/L (ref 0–35)
AST: 16 U/L (ref 0–37)
Albumin: 3.5 g/dL (ref 3.5–5.2)
Alkaline Phosphatase: 49 U/L (ref 39–117)
BILIRUBIN DIRECT: 0.2 mg/dL (ref 0.0–0.3)
TOTAL PROTEIN: 7.1 g/dL (ref 6.0–8.3)
Total Bilirubin: 0.9 mg/dL (ref 0.2–1.2)

## 2017-11-11 ENCOUNTER — Encounter: Payer: Self-pay | Admitting: Internal Medicine

## 2017-11-11 ENCOUNTER — Ambulatory Visit: Payer: Medicare Other | Admitting: Internal Medicine

## 2017-11-11 ENCOUNTER — Other Ambulatory Visit (HOSPITAL_COMMUNITY)
Admission: RE | Admit: 2017-11-11 | Discharge: 2017-11-11 | Disposition: A | Discharge status: Home / Self Care | Payer: Medicare Other | Source: Ambulatory Visit | Attending: Internal Medicine | Admitting: Internal Medicine

## 2017-11-11 VITALS — BP 138/72 | HR 71 | Temp 97.8°F | Resp 16 | Ht 62.0 in | Wt 142.1 lb

## 2017-11-11 DIAGNOSIS — R87610 Atypical squamous cells of undetermined significance on cytologic smear of cervix (ASC-US): Secondary | ICD-10-CM | POA: Diagnosis not present

## 2017-11-11 DIAGNOSIS — R87612 Low grade squamous intraepithelial lesion on cytologic smear of cervix (LGSIL): Secondary | ICD-10-CM | POA: Diagnosis not present

## 2017-11-11 DIAGNOSIS — R87810 Cervical high risk human papillomavirus (HPV) DNA test positive: Secondary | ICD-10-CM | POA: Diagnosis not present

## 2017-11-11 DIAGNOSIS — Z0001 Encounter for general adult medical examination with abnormal findings: Secondary | ICD-10-CM | POA: Diagnosis not present

## 2017-11-11 DIAGNOSIS — R9389 Abnormal findings on diagnostic imaging of other specified body structures: Secondary | ICD-10-CM

## 2017-11-11 DIAGNOSIS — D649 Anemia, unspecified: Secondary | ICD-10-CM

## 2017-11-11 DIAGNOSIS — E7800 Pure hypercholesterolemia, unspecified: Secondary | ICD-10-CM | POA: Diagnosis not present

## 2017-11-11 DIAGNOSIS — S81801A Unspecified open wound, right lower leg, initial encounter: Secondary | ICD-10-CM

## 2017-11-11 DIAGNOSIS — Z124 Encounter for screening for malignant neoplasm of cervix: Secondary | ICD-10-CM | POA: Diagnosis present

## 2017-11-11 DIAGNOSIS — Z0000 Encounter for general adult medical examination without abnormal findings: Secondary | ICD-10-CM

## 2017-11-11 MED ORDER — SCOPOLAMINE 1 MG/3DAYS TD PT72: 1.0000 | MEDICATED_PATCH | TRANSDERMAL | 0 refills | Status: DC

## 2017-11-11 MED ORDER — CEPHALEXIN 500 MG PO CAPS: 500.0000 mg | ORAL_CAPSULE | Freq: Three times a day (TID) | ORAL | 0 refills | Status: DC

## 2017-11-11 NOTE — Patient Instructions (Addendum)
Take a probiotic daily while you are on the antibiotic and continue for two weeks after completing the antibiotic.    Examples of probiotics:  Align, florastor or culturelle.    Call with update on leg.

## 2017-11-11 NOTE — Progress Notes (Signed)
Patient ID: VERSA CRATON, female   DOB: Nov 28, 1940, 77 y.o.   MRN: [id number]   Subjective:    Patient ID: Renee Vazquez, female    DOB: [birth year], 77 y.o.   MRN: [id number]  HPI  Patient here for her physical exam.  She reports she feels good.  Stays active.  No chest pain.  No sob.  No acid reflux.  No abdominal pain.  Bowels moving.  She previously had ASCUS noted on prior pap smear.  Negative HPV.  Was recommended for her to have f/u pap.  She was to have with gyn.  Informs me today she did not follow up with gyn.  Reluctantly agrees to f/u pap smear today.  She reported she hit her right lower leg on a chair several days ago.  Has an open area on her leg and some surrounding erythema.  No significant pain.  Planning to go on a trip soon.  Needs scopolamine patches.     Past Medical History:  Diagnosis Date  . Breast cancer ([place])    Dr Ewell Poe  . Hypercholesterolemia   . Osteopenia    Past Surgical History:  Procedure Laterality Date  . BREAST LUMPECTOMY  2007  . EYE SURGERY     detached retina   Family History  Problem Relation Age of Onset  . Hypertension Mother   . Hypercholesterolemia Mother   . Prostate cancer Father   . Breast cancer Maternal Aunt   . Melanoma Sister   . Vesicoureteral reflux Daughter   . Colon cancer Neg Hx    Social History   Socioeconomic History  . Marital status: Divorced    Spouse name: Not on file  . Number of children: 4  . Years of education: Not on file  . Highest education level: Not on file  Occupational History  . Occupation: retired  Scientific laboratory technician  . Financial resource strain: Not hard at all  . Food insecurity:    Worry: Never true    Inability: Never true  . Transportation needs:    Medical: Not on file    Non-medical: Not on file  Tobacco Use  . Smoking status: Never Smoker  . Smokeless tobacco: Never Used  Substance and Sexual Activity  . Alcohol use: Yes    Alcohol/week: 0.0 oz    Comment:  occasional  . Drug use: No  . Sexual activity: Never  Lifestyle  . Physical activity:    Days per week: 7 days    Minutes per session: 150+ min  . Stress: Not at all  Relationships  . Social connections:    Talks on phone: Not on file    Gets together: Not on file    Attends religious service: Not on file    Active member of club or organization: Not on file    Attends meetings of clubs or organizations: Not on file    Relationship status: Not on file  Other Topics Concern  . Not on file  Social History Narrative  . Not on file    Outpatient Encounter Medications as of 11/11/2017  Medication Sig  . calcium citrate-vitamin D 200-200 MG-UNIT TABS Take 1 tablet by mouth daily.  . cetirizine (ZYRTEC) 5 MG tablet Take 5 mg by mouth daily.  Marland Kitchen MELATONIN PO Take by mouth at bedtime as needed (sleep).  . Multiple Vitamins-Minerals (CENTRUM SILVER ADULT 50+ PO) Take by mouth.  . Multiple Vitamins-Minerals (PRESERVISION AREDS 2) CAPS Take 2 capsules by  mouth daily.  Marland Kitchen scopolamine (TRANSDERM-SCOP, 1.5 MG,) 1 MG/3DAYS Place 1 patch (1.5 mg total) onto the skin every 3 (three) days.  . [DISCONTINUED] scopolamine (TRANSDERM-SCOP, 1.5 MG,) 1 MG/3DAYS Place 1 patch (1.5 mg total) onto the skin every 3 (three) days. Hold refill until pt request.  . cephALEXin (KEFLEX) 500 MG capsule Take 1 capsule (500 mg total) by mouth 3 (three) times daily.   No facility-administered encounter medications on file as of 11/11/2017.     Review of Systems  Constitutional: Negative for appetite change, fever and unexpected weight change.  HENT: Negative for congestion and sinus pressure.   Eyes: Negative for pain and visual disturbance.  Respiratory: Negative for cough, chest tightness and shortness of breath.   Cardiovascular: Negative for chest pain, palpitations and leg swelling.  Gastrointestinal: Negative for abdominal pain, diarrhea, nausea and vomiting.  Genitourinary: Negative for difficulty urinating  and dysuria.  Musculoskeletal: Negative for joint swelling and myalgias.  Skin: Negative for color change and rash.  Neurological: Negative for dizziness, light-headedness and headaches.  Hematological: Negative for adenopathy. Does not bruise/bleed easily.  Psychiatric/Behavioral: Negative for agitation and dysphoric mood.       Objective:    Physical Exam  Constitutional: She is oriented to person, place, and time. She appears well-developed and well-nourished. No distress.  HENT:  Nose: Nose normal.  Mouth/Throat: Oropharynx is clear and moist.  Eyes: Right eye exhibits no discharge. Left eye exhibits no discharge. No scleral icterus.  Neck: Neck supple. No thyromegaly present.  Cardiovascular: Normal rate and regular rhythm.  Pulmonary/Chest: Breath sounds normal. No accessory muscle usage. No tachypnea. No respiratory distress. She has no decreased breath sounds. She has no wheezes. She has no rhonchi. Right breast exhibits no inverted nipple, no mass, no nipple discharge and no tenderness (no axillary adenopathy). Left breast exhibits no inverted nipple, no mass, no nipple discharge and no tenderness (no axilarry adenopathy).  Abdominal: Soft. Bowel sounds are normal. There is no tenderness.  Genitourinary:  Genitourinary Comments: Normal external genitalia.  Vaginal vault without lesions.  Atrophy changes present.  Cervix identified.  Pap smear performed.  Could not appreciate any adnexal masses or tenderness.    Musculoskeletal: She exhibits no edema or tenderness.  Lymphadenopathy:    She has no cervical adenopathy.  Neurological: She is alert and oriented to person, place, and time.  Skin: No rash noted. No erythema.  Psychiatric: She has a normal mood and affect. Her behavior is normal.    BP 138/72 (BP Location: Left Arm, Patient Position: Sitting, Cuff Size: Normal)   Pulse 71   Temp 97.8 F (36.6 C) (Oral)   Resp 16   Ht 5\' 2"  (1.575 m)   Wt 142 lb 2 oz (64.5 kg)    SpO2 98%   BMI 26.00 kg/m  Wt Readings from Last 3 Encounters:  11/11/17 142 lb 2 oz (64.5 kg)  09/18/17 138 lb 12.8 oz (63 kg)  06/09/17 138 lb 9.6 oz (62.9 kg)     Lab Results  Component Value Date   WBC 4.7 09/24/2016   HGB 13.7 09/24/2016   HCT 40.5 09/24/2016   PLT 249.0 09/24/2016   GLUCOSE 93 11/10/2017   CHOL 173 11/10/2017   TRIG 86.0 11/10/2017   HDL 66.30 11/10/2017   LDLDIRECT 152.4 08/16/2013   LDLCALC 89 11/10/2017   ALT 11 11/10/2017   AST 16 11/10/2017   NA 140 11/10/2017   K 4.4 11/10/2017   CL 107 11/10/2017  CREATININE 0.90 11/10/2017   BUN 17 11/10/2017   CO2 27 11/10/2017   TSH 4.37 04/02/2017       Assessment & Plan:   Problem List Items Addressed This Visit    Abnormal Pap smear of cervix    ASCUS noted on previous pap.  Negative HPV.  Was informed of results and was to f/u with gyn.  Informed me today she did not f/u with gyn.  Agreed to f/u pap today.  PAP today.       Anemia    Saw GI.  S/p EGD and colonoscopy.  Recommended f/u colonoscopy in 10 years.  Follow cbc.        Relevant Orders   CBC with Differential/Platelet   Health care maintenance    Physical today 11/11/17.  Mammogram 06/05/17 - birads II.  Colonoscopy 09/07/13 - recommended f/u colonoscopy in 10 years.        Hypercholesterolemia    Low cholesterol diet and exercise.  Follow lipid panel.       Relevant Orders   Hepatic function panel   Lipid panel   TSH   Basic metabolic panel   Thickened endometrium    Previous bleeding.  Found to have thickened endometrium.  S/p hysteroscopy, D&C and polypectomy.  Endo biopsy - benign.  Previous pap with ASCUS.  F/u pap as outlined.         Other Visit Diagnoses    Screening for cervical cancer    -  Primary   Relevant Orders   Cytology - PAP (Completed)   Wound of right lower extremity, initial encounter       Open lesion s/p injury.  Keflex as directed.  follow.         Einar Pheasant, MD

## 2017-11-11 NOTE — Progress Notes (Signed)
Pre-visit discussion using our clinic review tool. No additional management support is needed unless otherwise documented below in the visit note.  

## 2017-11-17 ENCOUNTER — Encounter: Payer: Self-pay | Admitting: Internal Medicine

## 2017-11-17 NOTE — Assessment & Plan Note (Signed)
Physical today 11/11/17.  Mammogram 06/05/17 - birads II.  Colonoscopy 09/07/13 - recommended f/u colonoscopy in 10 years.

## 2017-11-17 NOTE — Assessment & Plan Note (Signed)
Previous bleeding.  Found to have thickened endometrium.  S/p hysteroscopy, D&C and polypectomy.  Endo biopsy - benign.  Previous pap with ASCUS.  F/u pap as outlined.

## 2017-11-17 NOTE — Assessment & Plan Note (Signed)
Saw GI.  S/p EGD and colonoscopy.  Recommended f/u colonoscopy in 10 years.  Follow cbc.   

## 2017-11-17 NOTE — Assessment & Plan Note (Signed)
Low cholesterol diet and exercise.  Follow lipid panel.   

## 2017-11-17 NOTE — Assessment & Plan Note (Signed)
ASCUS noted on previous pap.  Negative HPV.  Was informed of results and was to f/u with gyn.  Informed me today she did not f/u with gyn.  Agreed to f/u pap today.  PAP today.

## 2017-11-18 ENCOUNTER — Telehealth: Payer: Self-pay

## 2017-11-18 ENCOUNTER — Other Ambulatory Visit: Payer: Self-pay | Admitting: Internal Medicine

## 2017-11-18 DIAGNOSIS — R87612 Low grade squamous intraepithelial lesion on cytologic smear of cervix (LGSIL): Secondary | ICD-10-CM

## 2017-11-18 NOTE — Telephone Encounter (Signed)
Patient calling back and states that she spoke with the insurance company and they told her that the medication is at the pharmacy and is on hold for a pharmacy review. Patient would like a call back from [place] to discuss this. Please advise. CB#: [phone]

## 2017-11-18 NOTE — Telephone Encounter (Signed)
Copied from [place] [id number]. Topic: Quick Communication - Office Called Patient >> Nov 18, 2017  7:51 AM Yvette Rack wrote: Reason for CRM: pt calling Juliann Pulse back she states that she called  her on Friday

## 2017-11-18 NOTE — Progress Notes (Signed)
Order placed for gyn referral.  

## 2017-11-18 NOTE — Telephone Encounter (Signed)
PA submitted to cover my meds for scopolamine Patches,  Key: [id number]

## 2017-11-20 ENCOUNTER — Telehealth: Payer: Self-pay

## 2017-11-20 NOTE — Telephone Encounter (Signed)
I think this should go to you since referral has already been placed

## 2017-11-20 NOTE — Telephone Encounter (Signed)
Copied from [place] [id number]. Topic: General - Other >> Nov 20, 2017  9:39 AM Carolyn Stare wrote:  Pt said if she can not get in with Dr Clayburn Pert she will see any OBGYN  pt will be out of town and her appt with a OBGYN will have to be after 12/11/17

## 2017-11-24 ENCOUNTER — Telehealth: Payer: Self-pay | Admitting: Internal Medicine

## 2017-11-24 NOTE — Telephone Encounter (Signed)
Copied from [place] [id number]. Topic: Quick Communication - Office Called Patient >> Nov 18, 2017  7:51 AM Yvette Rack wrote: Reason for CRM: pt calling Juliann Pulse back she states that she called  her on Friday >> Nov 24, 2017  3:19 PM Cleaster Corin, NT wrote: Pt. Calling to speak with Juliann Pulse. Pt. Is stating that [facility]. Is still needing more info.

## 2017-11-24 NOTE — Telephone Encounter (Signed)
Patient returning call.

## 2017-11-25 NOTE — Telephone Encounter (Signed)
Pt called back and I relayed message to her and she voiced understanding.

## 2017-11-25 NOTE — Telephone Encounter (Signed)
FYI

## 2017-11-25 NOTE — Telephone Encounter (Addendum)
Tried to reach patient by phone to let her know I have spoken with [facility] answered their questions for the scopolamine patches and they assured me it is under final pharmacy review and should be completed with in 24 hours. There was no answer left message to return call to office. PEC may advise patient.

## 2017-11-26 ENCOUNTER — Telehealth: Payer: Self-pay | Admitting: Internal Medicine

## 2017-11-26 NOTE — Telephone Encounter (Signed)
yes

## 2017-11-26 NOTE — Telephone Encounter (Signed)
Is this the patient you were talking with me about yesterday?

## 2017-11-26 NOTE — Telephone Encounter (Signed)
Form completed.

## 2017-11-26 NOTE — Telephone Encounter (Signed)
Please advise 

## 2017-11-26 NOTE — Telephone Encounter (Signed)
Caller name: Jackelyn Poling  Relation to pt: [facility] Call back number: [phone] fax [phone]   Reason for call:  [facility] has additional questions regarding PA, formed faxed today to [phone] requesting a call back to expedite PA, reference #   [id number]

## 2017-11-26 NOTE — Telephone Encounter (Signed)
Copied from [place] [id number]. Topic: Quick Communication - See Telephone Encounter >> Nov 26, 2017  4:10 PM Bea Graff, NT wrote: CRM for notification. See Telephone encounter for: 11/26/17. Renee Vazquez with [facility] calling to speak with Dr. Bary Leriche nurse regarding a change in dx for the pt. CB#: [phone]

## 2017-11-27 ENCOUNTER — Encounter: Payer: Self-pay | Admitting: Internal Medicine

## 2017-11-27 LAB — CYTOLOGY - PAP
Diagnosis: UNDETERMINED — AB
HPV: DETECTED — AB

## 2017-11-27 NOTE — Telephone Encounter (Signed)
Noted.  Hold for notification.  Will need to forward results to gyn.  See last result note.

## 2017-11-27 NOTE — Telephone Encounter (Signed)
Renee Vazquez would like a call back from [place] today @ [phone]

## 2017-11-27 NOTE — Telephone Encounter (Signed)
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 Peter Congo stated that addendum should be in Epic today.

## 2017-12-18 ENCOUNTER — Encounter: Payer: Self-pay | Admitting: Obstetrics and Gynecology

## 2017-12-18 ENCOUNTER — Ambulatory Visit (INDEPENDENT_AMBULATORY_CARE_PROVIDER_SITE_OTHER): Payer: Medicare Other | Admitting: Obstetrics and Gynecology

## 2017-12-18 VITALS — BP 140/80 | HR 78 | Ht 62.0 in | Wt 137.0 lb

## 2017-12-18 DIAGNOSIS — R87810 Cervical high risk human papillomavirus (HPV) DNA test positive: Secondary | ICD-10-CM

## 2017-12-18 DIAGNOSIS — R87610 Atypical squamous cells of undetermined significance on cytologic smear of cervix (ASC-US): Secondary | ICD-10-CM

## 2017-12-18 NOTE — Patient Instructions (Signed)
Colposcopy, Care After  This sheet gives you information about how to care for yourself after your procedure. Your doctor may also give you more specific instructions. If you have problems or questions, contact your doctor.  What can I expect after the procedure?  If you did not have a tissue sample removed (did not have a biopsy), you may only have some spotting for a few days. You can go back to your normal activities.  If you had a tissue sample removed, it is common to have:  · Soreness and pain. This may last for a few days.  · Light-headedness.  · Mild bleeding from your vagina or dark-colored, grainy discharge from your vagina. This may last for a few days. You may need to wear a sanitary pad.  · Spotting for at least 48 hours after the procedure.    Follow these instructions at home:  · Take over-the-counter and prescription medicines only as told by your doctor. Ask your doctor what medicines you can start taking again. This is very important if you take blood-thinning medicine.  · Do not drive or use heavy machinery while taking prescription pain medicine.  · For 3 days, or as long as your doctor tells you, avoid:  ? Douching.  ? Using tampons.  ? Having sex.  · If you use birth control (contraception), keep using it.  · Limit activity for the first day after the procedure. Ask your doctor what activities are safe for you.  · It is up to you to get the results of your procedure. Ask your doctor when your results will be ready.  · Keep all follow-up visits as told by your doctor. This is important.  Contact a doctor if:  · You get a skin rash.  Get help right away if:  · You are bleeding a lot from your vagina. It is a lot of bleeding if you are using more than one pad an hour for 2 hours in a row.  · You have clumps of blood (blood clots) coming from your vagina.  · You have a fever.  · You have chills  · You have pain in your lower belly (pelvic area).  · You have signs of infection, such as vaginal  discharge that is:  ? Different than usual.  ? Yellow.  ? Bad-smelling.  · You have very pain or cramps in your lower belly that do not get better with medicine.  · You feel light-headed.  · You feel dizzy.  · You pass out (faint).  Summary  · If you did not have a tissue sample removed (did not have a biopsy), you may only have some spotting for a few days. You can go back to your normal activities.  · If you had a tissue sample removed, it is common to have mild pain and spotting for 48 hours.  · For 3 days, or as long as your doctor tells you, avoid douching, using tampons and having sex.  · Get help right away if you have bleeding, very bad pain, or signs of infection.  This information is not intended to replace advice given to you by your health care provider. Make sure you discuss any questions you have with your health care provider.  Document Released: 11/27/2007 Document Revised: 02/28/2016 Document Reviewed: 02/28/2016  Elsevier Interactive Patient Education © 2018 [employer]

## 2017-12-18 NOTE — Progress Notes (Signed)
   GYNECOLOGY CLINIC COLPOSCOPY PROCEDURE NOTE  77 y.o. No obstetric history on file. here for colposcopy for ASCUS with POSITIVE high risk HPV  pap smear on May 2019. Discussed underlying role for HPV infection in the development of cervical dysplasia, its natural history and progression/regression, need for surveillance.  Is the patient  pregnant: No LMP: No LMP recorded. Patient is postmenopausal. Smoking status:  reports that she has never smoked. She has never used smokeless tobacco. Contraception: none Future fertility desired:  No  Patient given informed consent, signed copy in the chart, time out was performed.  The patient was position in dorsal lithotomy position. Speculum was placed the cervix was visualized.   After application of acetic acid colposcopic inspection of the cervix was undertaken.   Colposcopy adequate, full visualization of transformation zone: No, atrophic changes present no visible lesions; Random biopsies obtained at 10 and 1 o'clock.   ECC specimen obtained:  Yes  All specimens were labeled and sent to pathology.   Patient was given post procedure instructions.  Will follow up pathology and manage accordingly.  Routine preventative health maintenance measures emphasized.  Physical Exam  Constitutional: She is oriented to person, place, and time. She appears well-developed.  Genitourinary: There is no lesion on the right labia. There is no lesion on the left labia. Vagina exhibits no lesion. Right adnexum does not display mass. Left adnexum does not display mass. Cervix does not exhibit motion tenderness.    HENT:  Head: Normocephalic and atraumatic.  Eyes: Pupils are equal, round, and reactive to light. EOM are normal.  Neck: Neck supple. No thyromegaly present.  Cardiovascular: Normal rate.  Pulmonary/Chest: Right breast exhibits no inverted nipple, no mass, no nipple discharge and no skin change. Left breast exhibits no inverted nipple, no mass, no  nipple discharge and no skin change.  Abdominal: Soft. Bowel sounds are normal. She exhibits no distension and no mass.  Neurological: She is alert and oriented to person, place, and time.  Skin: Skin is warm and dry.  Psychiatric: She has a normal mood and affect. Her behavior is normal. Judgment and thought content normal.  Vitals reviewed.   Adrian Prows MD [facility], [facility] 12/18/17 9:45 AM

## 2017-12-22 LAB — PATHOLOGY

## 2017-12-23 NOTE — Progress Notes (Signed)
Negative, repeat pap in 1 year. Released to [employer]

## 2018-01-05 ENCOUNTER — Ambulatory Visit: Payer: Medicare Other | Admitting: Obstetrics and Gynecology

## 2018-01-28 ENCOUNTER — Telehealth: Payer: Self-pay | Admitting: Internal Medicine

## 2018-01-28 NOTE — Telephone Encounter (Signed)
Copied from [place] [phone]. Topic: General - Other >> Jan 28, 2018  4:36 PM Renee Vazquez, NT wrote: Reason for CRM: Patient called said she is getting calls to set up her AWV .she had this done in March and would like to know why

## 2018-01-28 NOTE — Telephone Encounter (Signed)
Im not sure who could be calling her I see where she has her AWV and she has a 6 month follow up with Dr. Nicki Reaper should she be scheduled for a physical.

## 2018-02-02 NOTE — Telephone Encounter (Signed)
Disregard last message. Did not mean to route to you

## 2018-02-02 NOTE — Telephone Encounter (Signed)
Left message letting patient know that I will try to figure out why they are calling to set up AWV

## 2018-02-02 NOTE — Telephone Encounter (Signed)
Noted  

## 2018-05-12 ENCOUNTER — Other Ambulatory Visit (INDEPENDENT_AMBULATORY_CARE_PROVIDER_SITE_OTHER): Payer: Medicare Other

## 2018-05-12 DIAGNOSIS — D649 Anemia, unspecified: Secondary | ICD-10-CM | POA: Diagnosis not present

## 2018-05-12 DIAGNOSIS — E7800 Pure hypercholesterolemia, unspecified: Secondary | ICD-10-CM | POA: Diagnosis not present

## 2018-05-12 LAB — BASIC METABOLIC PANEL
BUN: 19 mg/dL (ref 6–23)
CHLORIDE: 104 meq/L (ref 96–112)
CO2: 28 meq/L (ref 19–32)
CREATININE: 0.86 mg/dL (ref 0.40–1.20)
Calcium: 9.5 mg/dL (ref 8.4–10.5)
GFR: 68.07 mL/min (ref >60.00)
Glucose, Bld: 92 mg/dL (ref 70–99)
Potassium: 4.3 mEq/L (ref 3.5–5.1)
SODIUM: 139 meq/L (ref 135–145)

## 2018-05-12 LAB — HEPATIC FUNCTION PANEL
ALT: 15 U/L (ref 0–35)
AST: 16 U/L (ref 0–37)
Albumin: 3.8 g/dL (ref 3.5–5.2)
Alkaline Phosphatase: 66 U/L (ref 39–117)
BILIRUBIN DIRECT: 0.1 mg/dL (ref 0.0–0.3)
BILIRUBIN TOTAL: 0.6 mg/dL (ref 0.2–1.2)
TOTAL PROTEIN: 7.6 g/dL (ref 6.0–8.3)

## 2018-05-12 LAB — CBC WITH DIFFERENTIAL/PLATELET
BASOS ABS: 0.1 10*3/uL (ref 0.0–0.1)
BASOS PCT: 1 % (ref 0.0–3.0)
EOS ABS: 0.1 10*3/uL (ref 0.0–0.7)
Eosinophils Relative: 2.1 % (ref 0.0–5.0)
HCT: 41.4 % (ref 36.0–46.0)
Hemoglobin: 13.9 g/dL (ref 12.0–15.0)
Lymphocytes Relative: 33.6 % (ref 12.0–46.0)
Lymphs Abs: 1.8 10*3/uL (ref 0.7–4.0)
MCHC: 33.6 g/dL (ref 30.0–36.0)
MCV: 94.5 fl (ref 78.0–100.0)
MONO ABS: 0.6 10*3/uL (ref 0.1–1.0)
Monocytes Relative: 10.9 % (ref 3.0–12.0)
NEUTROS ABS: 2.9 10*3/uL (ref 1.4–7.7)
Neutrophils Relative %: 52.4 % (ref 43.0–77.0)
PLATELETS: 269 10*3/uL (ref 150.0–400.0)
RBC: 4.38 Mil/uL (ref 3.87–5.11)
RDW: 12.8 % (ref 11.5–15.5)
WBC: 5.5 10*3/uL (ref 4.0–10.5)

## 2018-05-12 LAB — LIPID PANEL
CHOLESTEROL: 194 mg/dL (ref 0–200)
HDL: 62.8 mg/dL (ref >39.00)
LDL CALC: 107 mg/dL — AB (ref 0–99)
NonHDL: 130.86
TRIGLYCERIDES: 120 mg/dL (ref 0.0–149.0)
Total CHOL/HDL Ratio: 3
VLDL: 24 mg/dL (ref 0.0–40.0)

## 2018-05-12 LAB — TSH: TSH: 3.87 u[IU]/mL (ref 0.35–4.50)

## 2018-05-14 ENCOUNTER — Ambulatory Visit: Payer: Medicare Other | Admitting: Internal Medicine

## 2018-05-14 ENCOUNTER — Encounter: Payer: Self-pay | Admitting: Internal Medicine

## 2018-05-14 VITALS — BP 128/70 | HR 80 | Temp 98.1°F | Resp 18 | Wt 138.4 lb

## 2018-05-14 DIAGNOSIS — R87610 Atypical squamous cells of undetermined significance on cytologic smear of cervix (ASC-US): Secondary | ICD-10-CM | POA: Diagnosis not present

## 2018-05-14 DIAGNOSIS — Z1239 Encounter for other screening for malignant neoplasm of breast: Secondary | ICD-10-CM

## 2018-05-14 DIAGNOSIS — E7800 Pure hypercholesterolemia, unspecified: Secondary | ICD-10-CM

## 2018-05-14 DIAGNOSIS — D649 Anemia, unspecified: Secondary | ICD-10-CM | POA: Diagnosis not present

## 2018-05-14 DIAGNOSIS — Z853 Personal history of malignant neoplasm of breast: Secondary | ICD-10-CM

## 2018-05-14 DIAGNOSIS — M8580 Other specified disorders of bone density and structure, unspecified site: Secondary | ICD-10-CM

## 2018-05-14 NOTE — Progress Notes (Signed)
Patient ID: Renee Vazquez, female   DOB: 01-19-1941, 77 y.o.   MRN: [id number]   Subjective:    Patient ID: Renee Vazquez, female    DOB: 07-06-1940, 77 y.o.   MRN: [id number]  HPI  Patient here for a scheduled follow up.  States that one month ago, she started having pain right lower back and down her right leg.  Radiated down to her knee.  Has been dancing and going up and down ladder a lot.  Took ibuprofen.  Better now.  No significant pain or problems.  Stays active.  No chest pain.  No sob.  No acid reflux.  No abdominal pain.  Bowels moving.  No urine change.  Was seen at [facility] recently for abnormal pap smear.  S/p colposcopy - negative.  Recommended f/u pap in one year.     Past Medical History:  Diagnosis Date  . Breast cancer ([place])    Dr Ewell Poe  . Hypercholesterolemia   . Osteopenia    Past Surgical History:  Procedure Laterality Date  . BREAST LUMPECTOMY  2007  . EYE SURGERY     detached retina   Family History  Problem Relation Age of Onset  . Hypertension Mother   . Hypercholesterolemia Mother   . Prostate cancer Father   . Breast cancer Maternal Aunt   . Melanoma Sister   . Vesicoureteral reflux Daughter   . Colon cancer Neg Hx    Social History   Socioeconomic History  . Marital status: Divorced    Spouse name: Not on file  . Number of children: 4  . Years of education: Not on file  . Highest education level: Not on file  Occupational History  . Occupation: retired  Scientific laboratory technician  . Financial resource strain: Not hard at all  . Food insecurity:    Worry: Never true    Inability: Never true  . Transportation needs:    Medical: Not on file    Non-medical: Not on file  Tobacco Use  . Smoking status: Never Smoker  . Smokeless tobacco: Never Used  Substance and Sexual Activity  . Alcohol use: Yes    Alcohol/week: 0.0 standard drinks    Comment: occasional  . Drug use: No  . Sexual activity: Never  Lifestyle  . Physical  activity:    Days per week: 7 days    Minutes per session: 150+ min  . Stress: Not at all  Relationships  . Social connections:    Talks on phone: Not on file    Gets together: Not on file    Attends religious service: Not on file    Active member of club or organization: Not on file    Attends meetings of clubs or organizations: Not on file    Relationship status: Not on file  Other Topics Concern  . Not on file  Social History Narrative  . Not on file    Outpatient Encounter Medications as of 05/14/2018  Medication Sig  . calcium citrate-vitamin D 200-200 MG-UNIT TABS Take 1 tablet by mouth daily.  . fexofenadine (ALLEGRA) 180 MG tablet Take 180 mg by mouth daily.  Marland Kitchen MELATONIN PO Take by mouth at bedtime as needed (sleep).  . Multiple Vitamins-Minerals (CENTRUM SILVER ADULT 50+ PO) Take by mouth.  . Multiple Vitamins-Minerals (PRESERVISION AREDS 2) CAPS Take 2 capsules by mouth daily.   No facility-administered encounter medications on file as of 05/14/2018.     Review of Systems  Constitutional: Negative for appetite change and unexpected weight change.  HENT: Negative for congestion and sinus pressure.   Respiratory: Negative for cough, chest tightness and shortness of breath.   Cardiovascular: Negative for chest pain, palpitations and leg swelling.  Gastrointestinal: Negative for abdominal pain, diarrhea, nausea and vomiting.  Genitourinary: Negative for difficulty urinating and dysuria.  Musculoskeletal: Negative for joint swelling and myalgias.  Skin: Negative for color change and rash.  Neurological: Negative for dizziness, light-headedness and headaches.  Psychiatric/Behavioral: Negative for agitation and dysphoric mood.       Objective:    Physical Exam  Constitutional: She appears well-developed and well-nourished. No distress.  HENT:  Nose: Nose normal.  Mouth/Throat: Oropharynx is clear and moist.  Neck: Neck supple. No thyromegaly present.    Cardiovascular: Normal rate and regular rhythm.  Pulmonary/Chest: Breath sounds normal. No respiratory distress. She has no wheezes.  Abdominal: Soft. Bowel sounds are normal. There is no tenderness.  Musculoskeletal: She exhibits no edema or tenderness.  Lymphadenopathy:    She has no cervical adenopathy.  Skin: No rash noted. No erythema.  Psychiatric: She has a normal mood and affect. Her behavior is normal.    BP 128/70 (BP Location: Left Arm, Patient Position: Sitting, Cuff Size: Normal)   Pulse 80   Temp 98.1 F (36.7 C) (Oral)   Resp 18   Wt 138 lb 6.4 oz (62.8 kg)   SpO2 96%   BMI 25.31 kg/m  Wt Readings from Last 3 Encounters:  05/14/18 138 lb 6.4 oz (62.8 kg)  12/18/17 137 lb (62.1 kg)  11/11/17 142 lb 2 oz (64.5 kg)     Lab Results  Component Value Date   WBC 5.5 05/12/2018   HGB 13.9 05/12/2018   HCT 41.4 05/12/2018   PLT 269.0 05/12/2018   GLUCOSE 92 05/12/2018   CHOL 194 05/12/2018   TRIG 120.0 05/12/2018   HDL 62.80 05/12/2018   LDLDIRECT 152.4 08/16/2013   LDLCALC 107 (H) 05/12/2018   ALT 15 05/12/2018   AST 16 05/12/2018   NA 139 05/12/2018   K 4.3 05/12/2018   CL 104 05/12/2018   CREATININE 0.86 05/12/2018   BUN 19 05/12/2018   CO2 28 05/12/2018   TSH 3.87 05/12/2018       Assessment & Plan:   Problem List Items Addressed This Visit    Abnormal Pap smear of cervix    Recently evaluated by gyn.  S/p colposcopy.  Negative.  Recommended f/u in one year.        Anemia    Saw GI.  S/p EGD and colonoscopy.  Recommended f/u colonoscopy in 10 years.  Follow cbc.        History of breast cancer    Mammogram 06/05/17 - birads II.  Schedule f/u mammogram at [facility].        Hypercholesterolemia    Discussed recent cholesterol labs.  Discussed calculated cholesterol risk.  She declines to start cholesterol medication. Low cholesterol diet and exercise.  Follow lipid panel.        Relevant Orders   Hepatic function panel   Lipid panel   Basic  metabolic panel   Osteopenia    Will let me know when agreeable for f/u bone density.  Continue weight bearing exercise.         Other Visit Diagnoses    Breast cancer screening    -  Primary   Relevant Orders   MM Digital Diagnostic Bilat  Einar Pheasant, MD

## 2018-05-15 ENCOUNTER — Telehealth: Payer: Self-pay

## 2018-05-15 NOTE — Telephone Encounter (Signed)
Copied from [place] [id number]. Topic: Quick Communication - See Telephone Encounter >> May 13, 2018  1:56 PM Dorna Leitz, CMA wrote: CRM for notification. See Telephone encounter for: 05/13/18. >> May 14, 2018  4:16 PM Bea Graff, NT wrote: Pt calling back. Can't find documentation of why pt was called. Please advise.    **I see no reason patient was called unless it was for possibly a flu shot reminder?**

## 2018-05-17 ENCOUNTER — Encounter: Payer: Self-pay | Admitting: Internal Medicine

## 2018-05-17 NOTE — Assessment & Plan Note (Signed)
Recently evaluated by gyn.  S/p colposcopy.  Negative.  Recommended f/u in one year.

## 2018-05-17 NOTE — Assessment & Plan Note (Signed)
Saw GI.  S/p EGD and colonoscopy.  Recommended f/u colonoscopy in 10 years.  Follow cbc.

## 2018-05-17 NOTE — Assessment & Plan Note (Signed)
Will let me know when agreeable for f/u bone density.  Continue weight bearing exercise.

## 2018-05-17 NOTE — Assessment & Plan Note (Signed)
Mammogram 06/05/17 - birads II.  Schedule f/u mammogram at [facility].

## 2018-05-17 NOTE — Assessment & Plan Note (Signed)
Discussed recent cholesterol labs.  Discussed calculated cholesterol risk.  She declines to start cholesterol medication. Low cholesterol diet and exercise.  Follow lipid panel.

## 2018-05-18 ENCOUNTER — Telehealth: Payer: Self-pay

## 2018-05-18 NOTE — Telephone Encounter (Addendum)
Relation to pt: self  Call back number: [phone]   Reason for call:  Patient checking on the status of orders,patient states she would like a phone when orders are fax so she can schedule appointment

## 2018-05-18 NOTE — Telephone Encounter (Signed)
Pt called back, she says that [facility] has no received fax regarding mammogram and is asking for it to be faxed again Please call pt when this is completed. [phone]

## 2018-05-18 NOTE — Telephone Encounter (Signed)
Copied from [place] [id number]. Topic: Referral - Status >> May 18, 2018  8:15 AM Scherrie Gerlach wrote: Reason for CRM: pt following up to inquire if the diagnostic mammagram order was put in for [facility] so she can call them to schedule her appt.   **I spoke with patient to let her know that order had been put in for mammogram.**

## 2018-05-19 ENCOUNTER — Other Ambulatory Visit: Payer: Self-pay

## 2018-05-19 DIAGNOSIS — Z853 Personal history of malignant neoplasm of breast: Secondary | ICD-10-CM

## 2018-05-19 NOTE — Telephone Encounter (Signed)
Please advise 

## 2018-05-19 NOTE — Telephone Encounter (Signed)
Patient called and said the wrong ICD  code was on order faxed to [facility].    She  says the code should be  Z85.3 and to please call her  Once this is corrected and she prefers this morning , please call her at [phone], she is really frustrated and states she will call back at 1200 if she has not heard anything from the practice . Marland Kitchen

## 2018-05-19 NOTE — Telephone Encounter (Signed)
Pt aware and has already been scheduled.

## 2018-05-19 NOTE — Telephone Encounter (Signed)
Pt would like a callback once mammogram order has been corrected and refax

## 2018-05-19 NOTE — Telephone Encounter (Signed)
Hard copy of order faxed over

## 2018-06-08 LAB — HM MAMMOGRAPHY

## 2018-11-02 ENCOUNTER — Ambulatory Visit (INDEPENDENT_AMBULATORY_CARE_PROVIDER_SITE_OTHER): Payer: Medicare Other

## 2018-11-02 ENCOUNTER — Other Ambulatory Visit: Payer: Self-pay

## 2018-11-02 DIAGNOSIS — Z0000 Encounter for general adult medical examination without abnormal findings: Secondary | ICD-10-CM | POA: Diagnosis not present

## 2018-11-02 NOTE — Patient Instructions (Addendum)
Renee Vazquez , Thank you for taking time to come for your Medicare Wellness Visit. I appreciate your ongoing commitment to your health goals. Please review the following plan we discussed and let me know if I can assist you in the future.   These are the goals we discussed: Goals    . Increase physical activity     Get back to dancing 5 days weekly Yoga 2-3 times       This is a list of the screening recommended for you and due dates:  Health Maintenance  Topic Date Due  . DEXA scan (bone density measurement)  09/27/2006  . Flu Shot  01/23/2019  . Tetanus Vaccine  12/15/2024  . Pneumonia vaccines  Completed    Bone Density Test The bone density test uses a special type of X-ray to measure the amount of calcium and other minerals in your bones. It can measure bone density in the hip and the spine. The test procedure is similar to having a regular X-ray. This test may also be called:  Bone densitometry.  Bone mineral density test.  Dual-energy X-ray absorptiometry (DEXA). You may have this test to:  Diagnose a condition that causes weak or thin bones (osteoporosis).  Screen you for osteoporosis.  Predict your risk for a broken bone (fracture).  Determine how well your osteoporosis treatment is working. Tell a health care provider about:  Any allergies you have.  All medicines you are taking, including vitamins, herbs, eye drops, creams, and over-the-counter medicines.  Any problems you or family members have had with anesthetic medicines.  Any blood disorders you have.  Any surgeries you have had.  Any medical conditions you have.  Whether you are pregnant or may be pregnant.  Any medical tests you have had within the past 14 days that used contrast material. What are the risks? Generally, this is a safe procedure. However, it does expose you to a small amount of radiation, which can slightly increase your cancer risk. What happens before the procedure?  Do  not take any calcium supplements starting 24 hours before your test.  Remove all metal jewelry, eyeglasses, dental appliances, and any other metal objects. What happens during the procedure?   You will lie down on an exam table. There will be an X-ray generator below you and an imaging device above you.  Other devices, such as boxes or braces, may be used to position your body properly for the scan.  The machine will slowly scan your body. You will need to keep still.  The images will show up on a screen in the room. Images will be examined by a specialist after your test is done. The procedure may vary among health care providers and hospitals. What happens after the procedure?  It is up to you to get your test results. Ask your health care provider, or the department that is doing the test, when your results will be ready. Summary  A bone density test is an imaging test that uses a type of X-ray to measure the amount of calcium and other minerals in your bones.  The test may be used to diagnose or screen you for a condition that causes weak or thin bones (osteoporosis), predict your risk for a broken bone (fracture), or determine how well your osteoporosis treatment is working.  Do not take any calcium supplements starting 24 hours before your test.  Ask your health care provider, or the department that is doing the test, when  your results will be ready. This information is not intended to replace advice given to you by your health care provider. Make sure you discuss any questions you have with your health care provider. Document Released: 07/02/2004 Document Revised: 04/14/2017 Document Reviewed: 04/14/2017 Elsevier Interactive Patient Education  2019 [employer].

## 2018-11-02 NOTE — Progress Notes (Signed)
Subjective:   Renee Vazquez is a 78 y.o. female who presents for Medicare Annual (Subsequent) preventive examination.  Review of Systems:  No ROS.  Medicare Wellness Virtual Visit.  Visual/audio telehealth visit, UTA vital signs.   See social history for additional risk factors.  Cardiac Risk Factors include: advanced age (>51men, >50 women)     Objective:     Vitals: There were no vitals taken for this visit.  There is no height or weight on file to calculate BMI.  Advanced Directives 11/02/2018 09/18/2017 09/17/2016 09/18/2015  Does Patient Have a Medical Advance Directive? Yes Yes Yes Yes  Type of Advance Directive Living will;Healthcare Power of Attorney Living will;Healthcare Power of Attorney Living will;Healthcare Power of [place];Living will  Does patient want to make changes to medical advance directive? No - Patient declined No - Patient declined No - Patient declined -  Copy of [place] in Chart? No - copy requested No - copy requested No - copy requested No - copy requested    Tobacco Social History   Tobacco Use  Smoking Status Never Smoker  Smokeless Tobacco Never Used     Counseling given: Not Answered   Clinical Intake:  Pre-visit preparation completed: Yes        Diabetes: No  How often do you need to have someone help you when you read instructions, pamphlets, or other written materials from your doctor or pharmacy?: 1 - Never  Interpreter Needed?: No     Past Medical History:  Diagnosis Date  . Breast cancer ([place])    Dr Ewell Poe  . Hypercholesterolemia   . Osteopenia    Past Surgical History:  Procedure Laterality Date  . BREAST LUMPECTOMY  2007  . EYE SURGERY     detached retina   Family History  Problem Relation Age of Onset  . Hypertension Mother   . Hypercholesterolemia Mother   . Prostate cancer Father   . Breast cancer Maternal Aunt   . Melanoma Sister   .  Vesicoureteral reflux Daughter   . Colon cancer Neg Hx    Social History   Socioeconomic History  . Marital status: Divorced    Spouse name: Not on file  . Number of children: 4  . Years of education: Not on file  . Highest education level: Not on file  Occupational History  . Occupation: retired  Scientific laboratory technician  . Financial resource strain: Not hard at all  . Food insecurity:    Worry: Never true    Inability: Never true  . Transportation needs:    Medical: No    Non-medical: No  Tobacco Use  . Smoking status: Never Smoker  . Smokeless tobacco: Never Used  Substance and Sexual Activity  . Alcohol use: Yes    Alcohol/week: 0.0 standard drinks    Comment: occasional  . Drug use: No  . Sexual activity: Never  Lifestyle  . Physical activity:    Days per week: 7 days    Minutes per session: 150+ min  . Stress: Not at all  Relationships  . Social connections:    Talks on phone: Not on file    Gets together: Not on file    Attends religious service: Not on file    Active member of club or organization: Not on file    Attends meetings of clubs or organizations: Not on file    Relationship status: Not on file  Other Topics  Concern  . Not on file  Social History Narrative  . Not on file    Outpatient Encounter Medications as of 11/02/2018  Medication Sig  . calcium citrate-vitamin D 200-200 MG-UNIT TABS Take 1 tablet by mouth daily.  . fexofenadine (ALLEGRA) 180 MG tablet Take 180 mg by mouth daily.  Marland Kitchen MELATONIN PO Take by mouth at bedtime as needed (sleep).  . Multiple Vitamins-Minerals (CENTRUM SILVER ADULT 50+ PO) Take by mouth.  . Multiple Vitamins-Minerals (PRESERVISION AREDS 2) CAPS Take 2 capsules by mouth daily.   No facility-administered encounter medications on file as of 11/02/2018.     Activities of Daily Living In your present state of health, do you have any difficulty performing the following activities: 11/02/2018  Hearing? N  Vision? N  Difficulty  concentrating or making decisions? N  Walking or climbing stairs? N  Dressing or bathing? N  Doing errands, shopping? N  Preparing Food and eating ? N  Using the Toilet? N  In the past six months, have you accidently leaked urine? N  Do you have problems with loss of bowel control? N  Managing your Medications? N  Managing your Finances? N  Housekeeping or managing your Housekeeping? N  Some recent data might be hidden    Patient Care Team: Einar Pheasant, MD as PCP - General (Internal Medicine)    Assessment:   This is a routine wellness examination for [place].  I connected with patient 11/02/18 at  9:00 AM EDT by a audio enabled telemedicine application and verified that I am speaking with the correct person using two identifiers. Patient stated full name and DOB. Patient gave permission to continue with virtual visit. Patient's location was at home and Nurse's location was at [place] office.   Labs scheduled 5/22 OV with pcp 5/26, declines virtual  Health Screenings  Mammogram- [facility] 05/2018 Colonoscopy- 08/2013 Bone Density - discussed. Glaucoma -none Hearing -demonstrates normal hearing during visit. Glucose - 04/2018 (92) TSH 04/2018 (3.87) Cholesterol - 04/2018 (194) Dental- [facility]- visits within the last 12 months.  Social  Alcohol intake -  yes      Smoking history- never    Smokers in home? none Illicit drug use? none Exercise - stays active around the home during Covid 19 quarantine.  Diet - regular diet Sexually Active -never BMI- discussed the importance of a healthy diet, water intake and the benefits of aerobic exercise.  Educational material provided.   Safety  Patient feels safe at home- yes Patient does have smoke detectors at home- yes Patient does wear sunscreen or protective clothing when in direct sunlight -yes Patient does wear seat belt when in a moving vehicle -yes  Covid-19 precautions and sickness symptoms discussed.   Activities  of Daily Living Patient denies needing assistance with: driving, household chores, feeding themselves, getting from bed to chair, getting to the toilet, bathing/showering, dressing, managing money, or preparing meals.  No new identified risk were noted.    Depression Screen Patient denies losing interest in daily life, feeling hopeless, or crying easily over simple problems.   Medication-taking as directed and without issues.   Fall Screen Patient denies being afraid of falling or falling in the last year.   Memory Screen Patient is alert.  Patient denies difficulty focusing, concentrating or misplacing items. Correctly identified the president of the [place] , season and recall. Patient likes to read and listen to music for brain stimulation. Completes about 1 book per week.   Immunizations The following  Immunizations were discussed: Influenza, shingles, pneumonia, and tetanus.   Other Providers Patient Care Team: Einar Pheasant, MD as PCP - General (Internal Medicine)  Exercise Activities and Dietary recommendations Current Exercise Habits: Home exercise routine, Frequency (Times/Week): 7, Intensity: Moderate  Goals    . Increase physical activity     Get back to dancing 5 days weekly Yoga 2-3 times       Fall Risk Fall Risk  11/02/2018 09/18/2017 09/17/2016 04/04/2016 09/18/2015  Falls in the past year? 0 No No No No   Depression Screen PHQ 2/9 Scores 11/02/2018 09/18/2017 09/17/2016 04/04/2016  PHQ - 2 Score 0 0 0 0     Cognitive Function MMSE - Mini Mental State Exam 09/17/2016 09/18/2015  Orientation to time 5 5  Orientation to Place 5 5  Registration 3 3  Attention/ Calculation 5 5  Recall 3 3  Language- name 2 objects 2 2  Language- repeat 1 1  Language- follow 3 step command 3 3  Language- read & follow direction 1 1  Write a sentence 1 1  Copy design 1 1  Total score 30 30     6CIT Screen 11/02/2018 09/18/2017  What Year? 0 points 0 points  What month? 0  points 0 points  What time? 0 points 0 points  Count back from 20 0 points 0 points  Months in reverse 0 points 0 points  Repeat phrase 0 points 0 points  Total Score 0 0    Immunization History  Administered Date(s) Administered  . Influenza Split 04/15/2014  . Influenza, High Dose Seasonal PF 04/11/2018  . Influenza-Unspecified 04/06/2015, 04/22/2016, 03/24/2017  . Pneumococcal Conjugate-13 03/11/2014, 03/06/2018  . Pneumococcal Polysaccharide-23 09/13/2015  . Tdap 12/16/2014  . Zoster 08/17/2012   Screening Tests Health Maintenance  Topic Date Due  . DEXA SCAN  09/27/2006  . INFLUENZA VACCINE  01/23/2019  . TETANUS/TDAP  12/15/2024  . PNA vac Low Risk Adult  Completed      Plan:    End of life planning; Advance aging; Advanced directives discussed.  Copy of current HCPOA/Living Will requested.    I have personally reviewed and noted the following in the patient's chart:   . Medical and social history . Use of alcohol, tobacco or illicit drugs  . Current medications and supplements . Functional ability and status . Nutritional status . Physical activity . Advanced directives . List of other physicians . Hospitalizations, surgeries, and ER visits in previous 12 months . Vitals . Screenings to include cognitive, depression, and falls . Referrals and appointments  In addition, I have reviewed and discussed with patient certain preventive protocols, quality metrics, and best practice recommendations. A written personalized care plan for preventive services as well as general preventive health recommendations were provided to patient.     Varney Biles, LPN  9/50/9326   Reviewed above information.  Agree with assessment and plan.    Dr Nicki Reaper

## 2018-11-13 ENCOUNTER — Other Ambulatory Visit: Payer: Medicare Other

## 2018-11-17 ENCOUNTER — Ambulatory Visit: Payer: Medicare Other | Admitting: Internal Medicine

## 2018-12-23 ENCOUNTER — Other Ambulatory Visit (HOSPITAL_COMMUNITY)
Admission: RE | Admit: 2018-12-23 | Discharge: 2018-12-23 | Disposition: A | Discharge status: Home / Self Care | Payer: Medicare Other | Source: Ambulatory Visit | Attending: Obstetrics and Gynecology | Admitting: Obstetrics and Gynecology

## 2018-12-23 ENCOUNTER — Other Ambulatory Visit: Payer: Self-pay

## 2018-12-23 ENCOUNTER — Encounter: Payer: Self-pay | Admitting: Obstetrics and Gynecology

## 2018-12-23 ENCOUNTER — Ambulatory Visit (INDEPENDENT_AMBULATORY_CARE_PROVIDER_SITE_OTHER): Payer: Medicare Other | Admitting: Obstetrics and Gynecology

## 2018-12-23 VITALS — BP 128/78 | HR 82 | Ht 62.0 in | Wt 139.0 lb

## 2018-12-23 DIAGNOSIS — N852 Hypertrophy of uterus: Secondary | ICD-10-CM

## 2018-12-23 DIAGNOSIS — Z0000 Encounter for general adult medical examination without abnormal findings: Secondary | ICD-10-CM

## 2018-12-23 DIAGNOSIS — R87615 Unsatisfactory cytologic smear of cervix: Secondary | ICD-10-CM | POA: Insufficient documentation

## 2018-12-23 DIAGNOSIS — Z124 Encounter for screening for malignant neoplasm of cervix: Secondary | ICD-10-CM | POA: Insufficient documentation

## 2018-12-23 DIAGNOSIS — Z01419 Encounter for gynecological examination (general) (routine) without abnormal findings: Secondary | ICD-10-CM | POA: Diagnosis not present

## 2018-12-23 DIAGNOSIS — Z1151 Encounter for screening for human papillomavirus (HPV): Secondary | ICD-10-CM | POA: Diagnosis not present

## 2018-12-23 DIAGNOSIS — Z780 Asymptomatic menopausal state: Secondary | ICD-10-CM | POA: Diagnosis not present

## 2018-12-23 DIAGNOSIS — Z13820 Encounter for screening for osteoporosis: Secondary | ICD-10-CM

## 2018-12-23 NOTE — Progress Notes (Signed)
Gynecology Annual Exam  PCP: Einar Pheasant, MD  Chief Complaint:  Chief Complaint  Patient presents with  . Gynecologic Exam    History of Present Illness:Patient is a 78 y.o. No obstetric history on file. presents for annual exam. The patient has no complaints today.   LMP: No LMP recorded. Patient is postmenopausal. Denies postmenopausal bleeding  The patient is not sexually active. The patient does perform self breast exams.  There is notable family history of breast or ovarian cancer in her family.  The patient wears seatbelts: yes.   The patient has regular exercise: no.    The patient denies current symptoms of depression.     Review of Systems: ROS  Past Medical History:  Past Medical History:  Diagnosis Date  . Breast cancer ([place])    Dr Ewell Poe  . Hypercholesterolemia   . Osteopenia     Past Surgical History:  Past Surgical History:  Procedure Laterality Date  . BREAST LUMPECTOMY  2007  . EYE SURGERY     detached retina    Gynecologic History:  No LMP recorded. Patient is postmenopausal. Last Pap: Results were: 11/11/2017 Ascus HPV+, colposcopy negative.  Last mammogram: 06/08/2018   Obstetric History: No obstetric history on file.  Family History:  Family History  Problem Relation Age of Onset  . Hypertension Mother   . Hypercholesterolemia Mother   . Prostate cancer Father   . Breast cancer Maternal Aunt   . Melanoma Sister   . Vesicoureteral reflux Daughter   . Colon cancer Neg Hx     Social History:  Social History   Socioeconomic History  . Marital status: Divorced    Spouse name: Not on file  . Number of children: 4  . Years of education: Not on file  . Highest education level: Not on file  Occupational History  . Occupation: retired  Scientific laboratory technician  . Financial resource strain: Not hard at all  . Food insecurity    Worry: Never true    Inability: Never true  . Transportation needs    Medical: No   Non-medical: No  Tobacco Use  . Smoking status: Never Smoker  . Smokeless tobacco: Never Used  Substance and Sexual Activity  . Alcohol use: Yes    Alcohol/week: 0.0 standard drinks    Comment: occasional  . Drug use: No  . Sexual activity: Not Currently    Birth control/protection: Post-menopausal  Lifestyle  . Physical activity    Days per week: 7 days    Minutes per session: 150+ min  . Stress: Not at all  Relationships  . Social Herbalist on phone: Not on file    Gets together: Not on file    Attends religious service: Not on file    Active member of club or organization: Not on file    Attends meetings of clubs or organizations: Not on file    Relationship status: Not on file  . Intimate partner violence    Fear of current or ex partner: Not on file    Emotionally abused: Not on file    Physically abused: Not on file    Forced sexual activity: Not on file  Other Topics Concern  . Not on file  Social History Narrative  . Not on file    Allergies:  Allergies  Allergen Reactions  . No Known Drug Allergy     Medications: Prior to Admission medications   Medication Sig  Start Date End Date Taking? Authorizing Provider  calcium citrate-vitamin D 200-200 MG-UNIT TABS Take 1 tablet by mouth daily.   Yes [provider]  fexofenadine (ALLEGRA) 180 MG tablet Take 180 mg by mouth daily.   Yes [provider]  MELATONIN PO Take by mouth at bedtime as needed (sleep).   Yes [provider]  Multiple Vitamins-Minerals (CENTRUM SILVER ADULT 50+ PO) Take by mouth.   Yes [provider]  Multiple Vitamins-Minerals (PRESERVISION AREDS 2) CAPS Take 2 capsules by mouth daily.   Yes [provider]    Physical Exam Vitals: Blood pressure 128/78, pulse 82, height 5\' 2"  (1.575 m), weight 139 lb (63 kg).  General: NAD HEENT: normocephalic, anicteric Thyroid: no enlargement, no palpable nodules Pulmonary: No increased work of  breathing, CTAB Cardiovascular: RRR, distal pulses 2+ Breast: Breast symmetrical, no tenderness, no palpable nodules or masses, no skin or nipple retraction present, no nipple discharge.  No axillary or supraclavicular lymphadenopathy. Abdomen: NABS, soft, non-tender, non-distended.  Umbilicus without lesions.  No hepatomegaly, splenomegaly or masses palpable. No evidence of hernia  Genitourinary:  External: Normal external female genitalia.  Normal urethral meatus, normal Bartholin's and Skene's glands.    Vagina: no evidence of prolapse.  Significant vaginal atrophy with a short/blunted vagina  Cervix: could nto be visualized today.   Uterus: Non-enlarged, mobile, normal contour.  No CMT  Adnexa: ovaries non-enlarged, no adnexal masses  Rectal: normal, no masses, thin rectal-vaginal wall.  Lymphatic: no evidence of inguinal lymphadenopathy Extremities: no edema, erythema, or tenderness Neurologic: Grossly intact Psychiatric: mood appropriate, affect full  Female chaperone present for pelvic and breast  portions of the physical exam     Assessment: 78 y.o. No obstetric history on file. routine annual exam  Plan: Problem List Items Addressed This Visit    None    Visit Diagnoses    Bulky or enlarged uterus    -  Primary   Relevant Orders   US PELVIS TRANSVAGINAL NON-OB (TV ONLY)   Cervical cancer screening       Relevant Orders   Cytology - PAP   Osteoporosis screening       Relevant Orders   DG Bone Density      1) Mammogram - recommend yearly screening mammogram.  Mammogram Was ordered today  2) STI screening  was not offered and therefore not obtained  3) ASCCP guidelines and rational discussed.  Patient opts for yearly screening interval  4) Osteoporosis  - per USPTF routine screening DEXA at age 99 -DEXA scan ordered  5) Routine healthcare maintenance including cholesterol, diabetes screening discussed managed by PCP   6)Could not see cervix on exam today.  Vaginal atrophy, short blunted vagina, possible adhesions making visualization of cervix difficult.  Will follow up for vaginal Korea  7)Return in about 2 weeks (around 01/06/2019) for Korea and GYN visit.  Adrian Prows MD [facility], [facility] 12/23/2018 11:34 PM

## 2018-12-24 ENCOUNTER — Other Ambulatory Visit (INDEPENDENT_AMBULATORY_CARE_PROVIDER_SITE_OTHER): Payer: Medicare Other

## 2018-12-24 ENCOUNTER — Other Ambulatory Visit: Payer: Self-pay

## 2018-12-24 DIAGNOSIS — E7800 Pure hypercholesterolemia, unspecified: Secondary | ICD-10-CM | POA: Diagnosis not present

## 2018-12-24 LAB — BASIC METABOLIC PANEL
BUN: 17 mg/dL (ref 6–23)
CO2: 27 mEq/L (ref 19–32)
Calcium: 8.9 mg/dL (ref 8.4–10.5)
Chloride: 106 mEq/L (ref 96–112)
Creatinine, Ser: 0.94 mg/dL (ref 0.40–1.20)
GFR: 57.71 mL/min — ABNORMAL LOW (ref >60.00)
Glucose, Bld: 87 mg/dL (ref 70–99)
Potassium: 4.6 mEq/L (ref 3.5–5.1)
Sodium: 140 mEq/L (ref 135–145)

## 2018-12-24 LAB — HEPATIC FUNCTION PANEL
ALT: 10 U/L (ref 0–35)
AST: 16 U/L (ref 0–37)
Albumin: 3.7 g/dL (ref 3.5–5.2)
Alkaline Phosphatase: 58 U/L (ref 39–117)
Bilirubin, Direct: 0.1 mg/dL (ref 0.0–0.3)
Total Bilirubin: 0.7 mg/dL (ref 0.2–1.2)
Total Protein: 7 g/dL (ref 6.0–8.3)

## 2018-12-24 LAB — LIPID PANEL
Cholesterol: 200 mg/dL (ref 0–200)
HDL: 61.3 mg/dL (ref >39.00)
LDL Cholesterol: 117 mg/dL — ABNORMAL HIGH (ref 0–99)
NonHDL: 138.47
Total CHOL/HDL Ratio: 3
Triglycerides: 108 mg/dL (ref 0.0–149.0)
VLDL: 21.6 mg/dL (ref 0.0–40.0)

## 2018-12-28 ENCOUNTER — Ambulatory Visit (INDEPENDENT_AMBULATORY_CARE_PROVIDER_SITE_OTHER): Payer: Medicare Other | Admitting: Internal Medicine

## 2018-12-28 ENCOUNTER — Other Ambulatory Visit: Payer: Self-pay | Admitting: Obstetrics and Gynecology

## 2018-12-28 ENCOUNTER — Other Ambulatory Visit: Payer: Self-pay

## 2018-12-28 DIAGNOSIS — R87610 Atypical squamous cells of undetermined significance on cytologic smear of cervix (ASC-US): Secondary | ICD-10-CM | POA: Diagnosis not present

## 2018-12-28 DIAGNOSIS — R9389 Abnormal findings on diagnostic imaging of other specified body structures: Secondary | ICD-10-CM

## 2018-12-28 DIAGNOSIS — Z13820 Encounter for screening for osteoporosis: Secondary | ICD-10-CM

## 2018-12-28 DIAGNOSIS — D649 Anemia, unspecified: Secondary | ICD-10-CM | POA: Diagnosis not present

## 2018-12-28 DIAGNOSIS — E7800 Pure hypercholesterolemia, unspecified: Secondary | ICD-10-CM | POA: Diagnosis not present

## 2018-12-28 DIAGNOSIS — Z0000 Encounter for general adult medical examination without abnormal findings: Secondary | ICD-10-CM

## 2018-12-28 DIAGNOSIS — Z853 Personal history of malignant neoplasm of breast: Secondary | ICD-10-CM

## 2018-12-28 NOTE — Progress Notes (Signed)
Patient ID: Renee Vazquez, female   DOB: [birth year], 78 y.o.   MRN: [id number] .   Virtual Visit via telephone Note  This visit type was conducted due to national recommendations for restrictions regarding the COVID-19 pandemic (e.g. social distancing).  This format is felt to be most appropriate for this patient at this time.  All issues noted in this document were discussed and addressed.  No physical exam was performed (except for noted visual exam findings with Video Visits).   I connected with Renee Vazquez by telephone and verified that I am speaking with the correct person using two identifiers. Location patient: home Location provider: work  Persons participating in the telephone visit: patient, provider  I discussed the limitations, risks, security and privacy concerns of performing an evaluation and management service by telephone and the availability of in person appointments.  The patient expressed understanding and agreed to proceed.   Reason for visit: scheduled follow up.   HPI: She reports she is doing well.  Feels good.  Stays active.  Back to line dancing - outside classes.  No chest pain.  No sob.  No acid reflux.  No abdominal pain.  Bowels moving.  No urine change.  Seeing Dr Gilman Schmidt.  Planning for f/u pelvic ultrasound 01/11/19.  Discussed recent labs.  Discussed calculated cholesterol risk.  She declines.  Blood pressure doing well.     ROS: See pertinent positives and negatives per HPI.  Past Medical History:  Diagnosis Date  . Breast cancer ([place])    Dr Ewell Poe  . Hypercholesterolemia   . Osteopenia     Past Surgical History:  Procedure Laterality Date  . BREAST LUMPECTOMY  2007  . EYE SURGERY     detached retina    Family History  Problem Relation Age of Onset  . Hypertension Mother   . Hypercholesterolemia Mother   . Prostate cancer Father   . Breast cancer Maternal Aunt   . Melanoma Sister   . Vesicoureteral reflux Daughter    . Colon cancer Neg Hx     SOCIAL HX: reviewed.    Current Outpatient Medications:  .  cetirizine (ZYRTEC) 10 MG tablet, Take 10 mg by mouth daily., Disp: , Rfl:  .  calcium citrate-vitamin D 200-200 MG-UNIT TABS, Take 1 tablet by mouth daily., Disp: , Rfl:  .  MELATONIN PO, Take by mouth at bedtime as needed (sleep)., Disp: , Rfl:  .  Multiple Vitamins-Minerals (CENTRUM SILVER ADULT 50+ PO), Take by mouth., Disp: , Rfl:  .  Multiple Vitamins-Minerals (PRESERVISION AREDS 2) CAPS, Take 2 capsules by mouth daily., Disp: , Rfl:   EXAM:  VITALS per patient if applicable: 967/89  GENERAL: alert.  Sounds to be in no acute distress.  Answering questions appropriately.    PSYCH/NEURO: pleasant and cooperative, no obvious depression or anxiety, speech and thought processing grossly intact  ASSESSMENT AND PLAN:  Discussed the following assessment and plan:  Anemia S/p EGD and colonoscopy 2015.  Recommended f/u in 10 years.  Follow cbc.    Abnormal Pap smear of cervix Seeing gyn.    History of breast cancer Followed at [facility].  06/08/18 - Birads II.    Hypercholesterolemia Discussed calculated cholesterol risk.  Discussed recommendation to start cholesterol medication.  She declines.  Follow lipid panel.   Thickened endometrium Seeing gyn as outlined.  Scheduled for f/u pelvic ultrasound.  Note reviewed.     I discussed the assessment and treatment plan with the patient.  The patient was provided an opportunity to ask questions and all were answered. The patient agreed with the plan and demonstrated an understanding of the instructions.   The patient was advised to call back or seek an in-person evaluation if the symptoms worsen or if the condition fails to improve as anticipated.  I provided 15 minutes of non-face-to-face time during this encounter.   Einar Pheasant, MD

## 2018-12-29 ENCOUNTER — Telehealth: Payer: Self-pay | Admitting: Obstetrics and Gynecology

## 2018-12-29 NOTE — Telephone Encounter (Signed)
Called and spoke with patient about schedule appointment. Patien aware of Date, location and Time. Patient requested Number incase that visit date wouldn't work for her. Patient aware of no Calcium products that morning prior to scan

## 2018-12-31 LAB — CYTOLOGY - PAP: HPV: NOT DETECTED

## 2019-01-02 ENCOUNTER — Encounter: Payer: Self-pay | Admitting: Internal Medicine

## 2019-01-02 NOTE — Assessment & Plan Note (Signed)
Seeing gyn as outlined.  Scheduled for f/u pelvic ultrasound.  Note reviewed.

## 2019-01-02 NOTE — Assessment & Plan Note (Signed)
Discussed calculated cholesterol risk.  Discussed recommendation to start cholesterol medication.  She declines.  Follow lipid panel.

## 2019-01-02 NOTE — Assessment & Plan Note (Signed)
Followed at [facility].  06/08/18 - Birads II.

## 2019-01-02 NOTE — Assessment & Plan Note (Signed)
Seeing gyn.  

## 2019-01-02 NOTE — Assessment & Plan Note (Signed)
S/p EGD and colonoscopy 2015.  Recommended f/u in 10 years.  Follow cbc.

## 2019-01-11 ENCOUNTER — Other Ambulatory Visit: Payer: Self-pay

## 2019-01-11 ENCOUNTER — Encounter: Payer: Self-pay | Admitting: Obstetrics and Gynecology

## 2019-01-11 ENCOUNTER — Ambulatory Visit (INDEPENDENT_AMBULATORY_CARE_PROVIDER_SITE_OTHER): Payer: Medicare Other | Admitting: Obstetrics and Gynecology

## 2019-01-11 ENCOUNTER — Ambulatory Visit (INDEPENDENT_AMBULATORY_CARE_PROVIDER_SITE_OTHER): Payer: Medicare Other

## 2019-01-11 VITALS — BP 130/86 | HR 75 | Ht 62.0 in | Wt 139.0 lb

## 2019-01-11 DIAGNOSIS — R87610 Atypical squamous cells of undetermined significance on cytologic smear of cervix (ASC-US): Secondary | ICD-10-CM | POA: Diagnosis not present

## 2019-01-11 DIAGNOSIS — R87810 Cervical high risk human papillomavirus (HPV) DNA test positive: Secondary | ICD-10-CM

## 2019-01-11 DIAGNOSIS — N870 Mild cervical dysplasia: Secondary | ICD-10-CM | POA: Diagnosis not present

## 2019-01-11 DIAGNOSIS — N852 Hypertrophy of uterus: Secondary | ICD-10-CM

## 2019-01-11 DIAGNOSIS — Z124 Encounter for screening for malignant neoplasm of cervix: Secondary | ICD-10-CM | POA: Diagnosis not present

## 2019-01-11 DIAGNOSIS — D259 Leiomyoma of uterus, unspecified: Secondary | ICD-10-CM | POA: Diagnosis not present

## 2019-01-11 NOTE — Progress Notes (Signed)
   Patient ID: Renee Vazquez, female   DOB: 05-30-1941, 78 y.o.   MRN: [id number]  Reason for Consult: Follow-up   Referred by Einar Pheasant, MD  Subjective:     HPI:  Renee Vazquez is a 78 y.o. female. She is following up for an ultrasound. Her pap smear was not able to be performed because of vaginal atrophy and adhesions which wad obscured the cervix from view. On [place] today the uterus and cervix are grossly normal. Pap smear was insufficient cellularity. Korea is not a replacement for pap smear. However, patient declines a second attempt at obtaining the pap smear today.   Past Medical History:  Diagnosis Date  . Breast cancer ([place])    Dr Ewell Poe  . Hypercholesterolemia   . Osteopenia    Family History  Problem Relation Age of Onset  . Hypertension Mother   . Hypercholesterolemia Mother   . Prostate cancer Father   . Breast cancer Maternal Aunt   . Melanoma Sister   . Vesicoureteral reflux Daughter   . Colon cancer Neg Hx    Past Surgical History:  Procedure Laterality Date  . BREAST LUMPECTOMY  2007  . EYE SURGERY     detached retina    Short Social History:  Social History   Tobacco Use  . Smoking status: Never Smoker  . Smokeless tobacco: Never Used  Substance Use Topics  . Alcohol use: Yes    Alcohol/week: 0.0 standard drinks    Comment: occasional    Allergies  Allergen Reactions  . No Known Drug Allergy     Current Outpatient Medications  Medication Sig Dispense Refill  . calcium citrate-vitamin D 200-200 MG-UNIT TABS Take 1 tablet by mouth daily.    . cetirizine (ZYRTEC) 10 MG tablet Take 10 mg by mouth daily.    Marland Kitchen MELATONIN PO Take by mouth at bedtime as needed (sleep).    . Multiple Vitamins-Minerals (CENTRUM SILVER ADULT 50+ PO) Take by mouth.    . Multiple Vitamins-Minerals (PRESERVISION AREDS 2) CAPS Take 2 capsules by mouth daily.     No current facility-administered medications for this visit.     REVIEW OF  SYSTEMS      Objective:  Objective   Vitals:   01/11/19 1059  BP: 130/86  Pulse: 75  Weight: 139 lb (63 kg)  Height: 5\' 2"  (1.575 m)   Body mass index is 25.42 kg/m.  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 Declines a second attempt at a pap smear collection today Grossly normal uterus and cervix on the ultrasound today She will discuss with her PCP and will possibly return next year for a pap smear.   More than 15 minutes were spent face to face with the patient in the room with more than 50% of the time spent providing counseling and discussing the plan of management.    Adrian Prows MD [facility], [facility] 01/11/2019 11:26 AM

## 2019-02-03 ENCOUNTER — Other Ambulatory Visit: Payer: Medicare Other

## 2019-02-10 ENCOUNTER — Encounter: Payer: Self-pay | Admitting: Internal Medicine

## 2019-02-11 NOTE — Telephone Encounter (Signed)
See if she is agreeable to schedule a telephone visit to discuss returning to indoor exercise, etc.  Let me know if a problems.  If agrees, can schedule her for telephone appt Friday 02/12/19.

## 2019-02-12 NOTE — Telephone Encounter (Signed)
Called pt. Instructor has cancelled indoor exercise. Keeping everything outdoor for now. Pt declined needing an appt.

## 2019-02-22 ENCOUNTER — Encounter: Payer: Self-pay | Admitting: Internal Medicine

## 2019-02-22 NOTE — Telephone Encounter (Signed)
I have typed the letter.

## 2019-02-23 NOTE — Telephone Encounter (Signed)
Letter printed for signature

## 2019-03-11 ENCOUNTER — Encounter: Payer: Self-pay | Admitting: Internal Medicine

## 2019-07-01 ENCOUNTER — Other Ambulatory Visit: Payer: Self-pay

## 2019-07-06 ENCOUNTER — Other Ambulatory Visit: Payer: Self-pay

## 2019-07-06 ENCOUNTER — Other Ambulatory Visit (INDEPENDENT_AMBULATORY_CARE_PROVIDER_SITE_OTHER): Payer: Medicare PPO

## 2019-07-06 DIAGNOSIS — E7800 Pure hypercholesterolemia, unspecified: Secondary | ICD-10-CM | POA: Diagnosis not present

## 2019-07-06 DIAGNOSIS — D649 Anemia, unspecified: Secondary | ICD-10-CM | POA: Diagnosis not present

## 2019-07-06 LAB — CBC WITH DIFFERENTIAL/PLATELET
Basophils Absolute: 0 10*3/uL (ref 0.0–0.1)
Basophils Relative: 0.8 % (ref 0.0–3.0)
Eosinophils Absolute: 0.1 10*3/uL (ref 0.0–0.7)
Eosinophils Relative: 1.7 % (ref 0.0–5.0)
HCT: 42.2 % (ref 36.0–46.0)
Hemoglobin: 13.9 g/dL (ref 12.0–15.0)
Lymphocytes Relative: 36.1 % (ref 12.0–46.0)
Lymphs Abs: 2.1 10*3/uL (ref 0.7–4.0)
MCHC: 33 g/dL (ref 30.0–36.0)
MCV: 95.5 fl (ref 78.0–100.0)
Monocytes Absolute: 0.6 10*3/uL (ref 0.1–1.0)
Monocytes Relative: 9.6 % (ref 3.0–12.0)
Neutro Abs: 3 10*3/uL (ref 1.4–7.7)
Neutrophils Relative %: 51.8 % (ref 43.0–77.0)
Platelets: 248 10*3/uL (ref 150.0–400.0)
RBC: 4.42 Mil/uL (ref 3.87–5.11)
RDW: 13.1 % (ref 11.5–15.5)
WBC: 5.8 10*3/uL (ref 4.0–10.5)

## 2019-07-06 LAB — BASIC METABOLIC PANEL
BUN: 15 mg/dL (ref 6–23)
CO2: 28 mEq/L (ref 19–32)
Calcium: 9.4 mg/dL (ref 8.4–10.5)
Chloride: 104 mEq/L (ref 96–112)
Creatinine, Ser: 0.96 mg/dL (ref 0.40–1.20)
GFR: 56.24 mL/min — ABNORMAL LOW (ref >60.00)
Glucose, Bld: 94 mg/dL (ref 70–99)
Potassium: 4.6 mEq/L (ref 3.5–5.1)
Sodium: 139 mEq/L (ref 135–145)

## 2019-07-06 LAB — HEPATIC FUNCTION PANEL
ALT: 10 U/L (ref 0–35)
AST: 16 U/L (ref 0–37)
Albumin: 3.7 g/dL (ref 3.5–5.2)
Alkaline Phosphatase: 58 U/L (ref 39–117)
Bilirubin, Direct: 0.1 mg/dL (ref 0.0–0.3)
Total Bilirubin: 0.7 mg/dL (ref 0.2–1.2)
Total Protein: 7.5 g/dL (ref 6.0–8.3)

## 2019-07-06 LAB — LIPID PANEL
Cholesterol: 203 mg/dL — ABNORMAL HIGH (ref 0–200)
HDL: 61 mg/dL (ref >39.00)
LDL Cholesterol: 114 mg/dL — ABNORMAL HIGH (ref 0–99)
NonHDL: 142.26
Total CHOL/HDL Ratio: 3
Triglycerides: 142 mg/dL (ref 0.0–149.0)
VLDL: 28.4 mg/dL (ref 0.0–40.0)

## 2019-07-06 LAB — TSH: TSH: 4.75 u[IU]/mL — ABNORMAL HIGH (ref 0.35–4.50)

## 2019-07-08 ENCOUNTER — Ambulatory Visit (INDEPENDENT_AMBULATORY_CARE_PROVIDER_SITE_OTHER): Payer: Medicare PPO | Admitting: Internal Medicine

## 2019-07-08 ENCOUNTER — Encounter: Payer: Self-pay | Admitting: Internal Medicine

## 2019-07-08 ENCOUNTER — Other Ambulatory Visit: Payer: Self-pay

## 2019-07-08 DIAGNOSIS — R7989 Other specified abnormal findings of blood chemistry: Secondary | ICD-10-CM | POA: Diagnosis not present

## 2019-07-08 DIAGNOSIS — Z853 Personal history of malignant neoplasm of breast: Secondary | ICD-10-CM | POA: Diagnosis not present

## 2019-07-08 DIAGNOSIS — E7800 Pure hypercholesterolemia, unspecified: Secondary | ICD-10-CM | POA: Diagnosis not present

## 2019-07-08 DIAGNOSIS — D649 Anemia, unspecified: Secondary | ICD-10-CM | POA: Diagnosis not present

## 2019-07-08 DIAGNOSIS — R87610 Atypical squamous cells of undetermined significance on cytologic smear of cervix (ASC-US): Secondary | ICD-10-CM

## 2019-07-08 NOTE — Assessment & Plan Note (Signed)
Slightly elevated tsh.  Recheck to confirm wnl.

## 2019-07-08 NOTE — Assessment & Plan Note (Signed)
S/p EGD and colonoscopy 2015.  Hgb just checked 07/06/19 - wnl.

## 2019-07-08 NOTE — Assessment & Plan Note (Signed)
Followed at [facility].  Mammogram 06/14/19 - birads II.

## 2019-07-08 NOTE — Assessment & Plan Note (Signed)
Discussed recent cholesterol results.  Discussed calculated cholesterol risk and recommendation to start a cholesterol medication.  She declines.  Wants to follow low cholesterol diet and exercise.  Follow lipid panel.

## 2019-07-08 NOTE — Assessment & Plan Note (Signed)
Saw gyn 12/2018.  Unable to do pap secondary to atrophy.  States they informed her no further f/u warranted.

## 2019-07-08 NOTE — Progress Notes (Signed)
Patient ID: Renee Vazquez, female   DOB: [birth year], 79 y.o.   MRN: [id number]   Virtual Visit via telephone Note  This visit type was conducted due to national recommendations for restrictions regarding the COVID-19 pandemic (e.g. social distancing).  This format is felt to be most appropriate for this patient at this time.  All issues noted in this document were discussed and addressed.  No physical exam was performed (except for noted visual exam findings with Video Visits).   I connected with [name] by a video enabled telemedicine application or telephone and verified that I am speaking with the correct person using two identifiers. Location patient: home Location provider: work  Persons participating in the telephone visit: patient, provider  The limitations, risks, security and privacy concerns of performing an evaluation and management service by telephone and the availability of in person appointments have been discussed. The patient expressed understanding and agreed to proceed.   Reason for visit:  Scheduled follow up.   HPI: She reports she is doing well.  Feels good.  Trying to stay as active as possible, but states she is not able to go anywhere because of covid restrictions.  No chest pain or sob with increased activity or exertion.  No acid reflux or abdominal pain reported.  No bowel change reported.  She did receive her covid vaccine.  Did well.  Up to date with mammogram.  Last 06/14/19 - Birads II.  Saw gyn 12/2018.  Unable to do pap.  States no further f/u warranted.  Overall she feel she is doing well.    ROS: See pertinent positives and negatives per HPI.  Past Medical History:  Diagnosis Date  . Breast cancer ([place])    Dr Ewell Poe  . Hypercholesterolemia   . Osteopenia     Past Surgical History:  Procedure Laterality Date  . BREAST LUMPECTOMY  2007  . EYE SURGERY     detached retina    Family History  Problem Relation Age of  Onset  . Hypertension Mother   . Hypercholesterolemia Mother   . Prostate cancer Father   . Breast cancer Maternal Aunt   . Melanoma Sister   . Vesicoureteral reflux Daughter   . Colon cancer Neg Hx     SOCIAL HX: reviewed.    Current Outpatient Medications:  .  calcium citrate-vitamin D 200-200 MG-UNIT TABS, Take 1 tablet by mouth daily., Disp: , Rfl:  .  cetirizine (ZYRTEC) 10 MG tablet, Take 10 mg by mouth daily., Disp: , Rfl:  .  MELATONIN PO, Take by mouth at bedtime as needed (sleep)., Disp: , Rfl:  .  Multiple Vitamins-Minerals (CENTRUM SILVER ADULT 50+ PO), Take by mouth., Disp: , Rfl:  .  Multiple Vitamins-Minerals (PRESERVISION AREDS 2) CAPS, Take 2 capsules by mouth daily., Disp: , Rfl:   EXAM:  GENERAL: alert. Sounds to be in no acute distress.  Answering questions appropriately.    PSYCH/NEURO: pleasant and cooperative, no obvious depression or anxiety, speech and thought processing grossly intact  ASSESSMENT AND PLAN:  Discussed the following assessment and plan:  History of breast cancer Followed at [facility].  Mammogram 06/14/19 - birads II.    Abnormal Pap smear of cervix Saw gyn 12/2018.  Unable to do pap secondary to atrophy.  States they informed her no further f/u warranted.    Anemia S/p EGD and colonoscopy 2015.  Hgb just checked 07/06/19 - wnl.    Hypercholesterolemia Discussed recent cholesterol results.  Discussed  calculated cholesterol risk and recommendation to start a cholesterol medication.  She declines.  Wants to follow low cholesterol diet and exercise.  Follow lipid panel.    Elevated TSH Slightly elevated tsh.  Recheck to confirm wnl.    Orders Placed This Encounter  Procedures  . TSH    Standing Status:   Future    Standing Expiration Date:   07/07/2020     I discussed the assessment and treatment plan with the patient. The patient was provided an opportunity to ask questions and all were answered. The patient agreed with the plan and  demonstrated an understanding of the instructions.   The patient was advised to call back or seek an in-person evaluation if the symptoms worsen or if the condition fails to improve as anticipated.  I provided 15 minutes of non-face-to-face time during this encounter.   Einar Pheasant, MD

## 2019-08-02 DIAGNOSIS — L820 Inflamed seborrheic keratosis: Secondary | ICD-10-CM | POA: Diagnosis not present

## 2019-08-02 DIAGNOSIS — Z853 Personal history of malignant neoplasm of breast: Secondary | ICD-10-CM | POA: Diagnosis not present

## 2019-08-02 DIAGNOSIS — L578 Other skin changes due to chronic exposure to nonionizing radiation: Secondary | ICD-10-CM | POA: Diagnosis not present

## 2019-08-02 DIAGNOSIS — D692 Other nonthrombocytopenic purpura: Secondary | ICD-10-CM | POA: Diagnosis not present

## 2019-08-02 DIAGNOSIS — Z1283 Encounter for screening for malignant neoplasm of skin: Secondary | ICD-10-CM | POA: Diagnosis not present

## 2019-08-02 DIAGNOSIS — L814 Other melanin hyperpigmentation: Secondary | ICD-10-CM | POA: Diagnosis not present

## 2019-08-02 DIAGNOSIS — L858 Other specified epidermal thickening: Secondary | ICD-10-CM | POA: Diagnosis not present

## 2019-08-02 DIAGNOSIS — D1801 Hemangioma of skin and subcutaneous tissue: Secondary | ICD-10-CM | POA: Diagnosis not present

## 2019-08-02 DIAGNOSIS — L821 Other seborrheic keratosis: Secondary | ICD-10-CM | POA: Diagnosis not present

## 2019-09-02 ENCOUNTER — Other Ambulatory Visit: Payer: Medicare PPO

## 2019-09-20 ENCOUNTER — Other Ambulatory Visit: Payer: Self-pay

## 2019-09-20 ENCOUNTER — Other Ambulatory Visit (INDEPENDENT_AMBULATORY_CARE_PROVIDER_SITE_OTHER): Payer: Medicare PPO

## 2019-09-20 DIAGNOSIS — R7989 Other specified abnormal findings of blood chemistry: Secondary | ICD-10-CM | POA: Diagnosis not present

## 2019-09-20 LAB — TSH: TSH: 3.29 u[IU]/mL (ref 0.35–4.50)

## 2019-09-21 ENCOUNTER — Encounter: Payer: Self-pay | Admitting: Internal Medicine

## 2019-11-03 ENCOUNTER — Ambulatory Visit (INDEPENDENT_AMBULATORY_CARE_PROVIDER_SITE_OTHER): Payer: Medicare PPO

## 2019-11-03 VITALS — Ht 62.0 in | Wt 140.0 lb

## 2019-11-03 DIAGNOSIS — Z0000 Encounter for general adult medical examination without abnormal findings: Secondary | ICD-10-CM | POA: Diagnosis not present

## 2019-11-03 NOTE — Patient Instructions (Addendum)
  [name] , Thank you for taking time to come for your Medicare Wellness Visit. I appreciate your ongoing commitment to your health goals. Please review the following plan we discussed and let me know if I can assist you in the future.   These are the goals we discussed: Goals      Patient Stated   . Increase physical activity (pt-stated)     Resume dancing classes Add Yoga classes       This is a list of the screening recommended for you and due dates:  Health Maintenance  Topic Date Due  . DEXA scan (bone density measurement)  Never done  . Flu Shot  01/23/2020  . Tetanus Vaccine  12/15/2024  . COVID-19 Vaccine  Completed  . Pneumonia vaccines  Completed

## 2019-11-03 NOTE — Progress Notes (Addendum)
Subjective:   Renee Vazquez is a 79 y.o. female who presents for Medicare Annual (Subsequent) preventive examination.  Review of Systems:  No ROS.  Medicare Wellness Virtual Visit.  Visual/audio telehealth visit. Ht/Wt provided.  See social history for additional risk factors.   Cardiac Risk Factors include: advanced age (>59men, >4 women)     Objective:     Vitals: Ht 5\' 2"  (1.575 m)   Wt 140 lb (63.5 kg)   BMI 25.61 kg/m   Body mass index is 25.61 kg/m.  Advanced Directives 11/03/2019 11/02/2018 09/18/2017 09/17/2016 09/18/2015  Does Patient Have a Medical Advance Directive? Yes Yes Yes Yes Yes  Type of Academic librarian Living will;Healthcare Power of Attorney Living will;Healthcare Power of Attorney Living will;Healthcare Power of [place];Living will  Does patient want to make changes to medical advance directive? No - Patient declined No - Patient declined No - Patient declined No - Patient declined -  Copy of [place] in Chart? No - copy requested No - copy requested No - copy requested No - copy requested No - copy requested    Tobacco Social History   Tobacco Use  Smoking Status Never Smoker  Smokeless Tobacco Never Used     Counseling given: Not Answered   Clinical Intake:  Pre-visit preparation completed: Yes        Diabetes: No  How often do you need to have someone help you when you read instructions, pamphlets, or other written materials from your doctor or pharmacy?: 1 - Never  Interpreter Needed?: No     Past Medical History:  Diagnosis Date  . Breast cancer ([place])    Dr Ewell Poe  . Hypercholesterolemia   . Osteopenia    Past Surgical History:  Procedure Laterality Date  . BREAST LUMPECTOMY  2007  . EYE SURGERY     detached retina   Family History  Problem Relation Age of Onset  . Hypertension Mother   . Hypercholesterolemia Mother   .  Prostate cancer Father   . Breast cancer Maternal Aunt   . Melanoma Sister   . Vesicoureteral reflux Daughter   . Colon cancer Neg Hx    Social History   Socioeconomic History  . Marital status: Divorced    Spouse name: Not on file  . Number of children: 4  . Years of education: Not on file  . Highest education level: Not on file  Occupational History  . Occupation: retired  Tobacco Use  . Smoking status: Never Smoker  . Smokeless tobacco: Never Used  Substance and Sexual Activity  . Alcohol use: Yes    Alcohol/week: 0.0 standard drinks    Comment: occasional  . Drug use: No  . Sexual activity: Not Currently    Birth control/protection: Post-menopausal  Other Topics Concern  . Not on file  Social History Narrative  . Not on file   Social Determinants of Health   Financial Resource Strain:   . Difficulty of Paying Living Expenses:   Food Insecurity:   . Worried About Charity fundraiser in the Last Year:   . Arboriculturist in the Last Year:   Transportation Needs:   . Film/video editor (Medical):   Marland Kitchen Lack of Transportation (Non-Medical):   Physical Activity:   . Days of Exercise per Week:   . Minutes of Exercise per Session:   Stress:   . Feeling of Stress :  Social Connections:   . Frequency of Communication with Friends and Family:   . Frequency of Social Gatherings with Friends and Family:   . Attends Religious Services:   . Active Member of Clubs or Organizations:   . Attends Archivist Meetings:   Marland Kitchen Marital Status:     Outpatient Encounter Medications as of 11/03/2019  Medication Sig  . calcium citrate-vitamin D 200-200 MG-UNIT TABS Take 1 tablet by mouth daily.  . cetirizine (ZYRTEC) 10 MG tablet Take 10 mg by mouth daily.  Marland Kitchen MELATONIN PO Take by mouth at bedtime as needed (sleep).  . Multiple Vitamins-Minerals (CENTRUM SILVER ADULT 50+ PO) Take by mouth.  . Multiple Vitamins-Minerals (PRESERVISION AREDS 2) CAPS Take 2 capsules by  mouth daily.   No facility-administered encounter medications on file as of 11/03/2019.    Activities of Daily Living In your present state of health, do you have any difficulty performing the following activities: 11/03/2019  Hearing? N  Vision? N  Difficulty concentrating or making decisions? N  Walking or climbing stairs? N  Dressing or bathing? N  Doing errands, shopping? N  Preparing Food and eating ? N  Using the Toilet? N  In the past six months, have you accidently leaked urine? N  Do you have problems with loss of bowel control? N  Managing your Medications? N  Managing your Finances? N  Housekeeping or managing your Housekeeping? N  Some recent data might be hidden    Patient Care Team: Einar Pheasant, MD as PCP - General (Internal Medicine)    Assessment:   This is a routine wellness examination for [place].  Nurse connected with patient 11/03/19 at  9:00 AM EDT by a telephone enabled telemedicine application, no virtual available and verified that I am speaking with the correct person using two identifiers. Patient stated full name and DOB. Patient gave permission to continue with visit. Patient's location was at home and Nurse's location was at [place] office.   Patient is alert and oriented x3. Patient denies difficulty focusing or concentrating. Patient likes to read and balances her own finances for brain health.   Health Maintenance Due: -Dexa Scan- deferred per patient preference See completed HM at the end of note.   Eye: Visual acuity not assessed. Virtual visit. Followed by their ophthalmologist.  Dental: Visits every 6 months.    Hearing: Demonstrates normal hearing during visit.  Safety:  Patient feels safe at home- yes Patient does have smoke detectors at home- yes Patient does wear sunscreen or protective clothing when in direct sunlight - yes Patient does wear seat belt when in a moving vehicle - yes Patient drives- yes Adequate  lighting in walkways free from debris- yes Grab bars and handrails used as appropriate- yes Ambulates with an assistive device- no Cell phone on person when ambulating outside of the home- yes  Social: Alcohol intake - yes      Smoking history- never  Smokers in home? none Illicit drug use? none  Medication: Taking as directed and without issues.   Covid-19: Precautions and sickness symptoms discussed. Wears mask, social distancing, hand hygiene as appropriate.   Activities of Daily Living Patient denies needing assistance with: household chores, feeding themselves, getting from bed to chair, getting to the toilet, bathing/showering, dressing, managing money, or preparing meals.   Discussed the importance of a healthy diet, water intake and the benefits of aerobic exercise.   Physical activity- dancing classes up 3-4 hours weekly.  Diet:  Healthy  Water: good intake  Other Providers Patient Care Team: Einar Pheasant, MD as PCP - General (Internal Medicine)  Exercise Activities and Dietary recommendations Current Exercise Habits: Home exercise routine, Time (Minutes): 60, Frequency (Times/Week): 2, Weekly Exercise (Minutes/Week): 120, Intensity: Mild  Goals      Patient Stated   . Increase physical activity (pt-stated)     Resume dancing classes Add Yoga classes Weight about 137lb       Fall Risk Fall Risk  11/03/2019 07/08/2019 11/02/2018 09/18/2017 09/17/2016  Falls in the past year? 0 0 0 No No  Follow up Falls evaluation completed Falls evaluation completed - - -   Timed Get Up and Go performed: no, virtual visit   Depression Screen PHQ 2/9 Scores 11/03/2019 11/02/2018 09/18/2017 09/17/2016  PHQ - 2 Score 0 0 0 0     Cognitive Function MMSE - Mini Mental State Exam 11/03/2019 09/17/2016 09/18/2015  Not completed: Unable to complete - -  Orientation to time - 5 5  Orientation to Place - 5 5  Registration - 3 3  Attention/ Calculation - 5 5  Recall - 3 3    Language- name 2 objects - 2 2  Language- repeat - 1 1  Language- follow 3 step command - 3 3  Language- read & follow direction - 1 1  Write a sentence - 1 1  Copy design - 1 1  Total score - 30 30     6CIT Screen 11/02/2018 09/18/2017  What Year? 0 points 0 points  What month? 0 points 0 points  What time? 0 points 0 points  Count back from 20 0 points 0 points  Months in reverse 0 points 0 points  Repeat phrase 0 points 0 points  Total Score 0 0    Immunization History  Administered Date(s) Administered  . Influenza Split 04/15/2014  . Influenza, High Dose Seasonal PF 04/11/2018  . Influenza-Unspecified 04/06/2015, 04/22/2016, 03/24/2017, 03/29/2019  . PFIZER SARS-COV-2 Vaccination 06/30/2019, 07/21/2019  . Pneumococcal Conjugate-13 03/11/2014, 03/06/2018  . Pneumococcal Polysaccharide-23 09/13/2015  . Tdap 12/16/2014  . Zoster 08/17/2012   Screening Tests Health Maintenance  Topic Date Due  . DEXA SCAN  Never done  . INFLUENZA VACCINE  01/23/2020  . TETANUS/TDAP  12/15/2024  . COVID-19 Vaccine  Completed  . PNA vac Low Risk Adult  Completed      Plan:   Keep all routine maintenance appointments.   Cpe 01/13/20 @ 11:30  Medicare Attestation I have personally reviewed: The patient's medical and social history Their use of alcohol, tobacco or illicit drugs Their current medications and supplements The patient's functional ability including ADLs,fall risks, home safety risks, cognitive, and hearing and visual impairment Diet and physical activities Evidence for depression   I have reviewed and discussed with patient certain preventive protocols, quality metrics, and best practice recommendations.     Varney Biles, LPN  [id number]   Reviewed above information.  Agree with assessment and plan.   Dr Nicki Reaper

## 2020-01-13 ENCOUNTER — Other Ambulatory Visit: Payer: Self-pay

## 2020-01-13 ENCOUNTER — Ambulatory Visit (INDEPENDENT_AMBULATORY_CARE_PROVIDER_SITE_OTHER): Payer: Medicare PPO | Admitting: Internal Medicine

## 2020-01-13 VITALS — BP 122/70 | HR 73 | Temp 98.1°F | Resp 16 | Ht 62.0 in | Wt 138.0 lb

## 2020-01-13 DIAGNOSIS — D649 Anemia, unspecified: Secondary | ICD-10-CM | POA: Diagnosis not present

## 2020-01-13 DIAGNOSIS — R87610 Atypical squamous cells of undetermined significance on cytologic smear of cervix (ASC-US): Secondary | ICD-10-CM | POA: Diagnosis not present

## 2020-01-13 DIAGNOSIS — Z0000 Encounter for general adult medical examination without abnormal findings: Secondary | ICD-10-CM | POA: Diagnosis not present

## 2020-01-13 DIAGNOSIS — E7800 Pure hypercholesterolemia, unspecified: Secondary | ICD-10-CM

## 2020-01-13 DIAGNOSIS — Z853 Personal history of malignant neoplasm of breast: Secondary | ICD-10-CM | POA: Diagnosis not present

## 2020-01-13 NOTE — Progress Notes (Signed)
Patient ID: Renee Vazquez, female   DOB: February 11, 1941, 79 y.o.   MRN: [id number]   Subjective:    Patient ID: Renee Vazquez, female    DOB: 1941-04-12, 79 y.o.   MRN: [id number]  HPI This visit occurred during the SARS-CoV-2 public health emergency.  Safety protocols were in place, including screening questions prior to the visit, additional usage of staff PPE, and extensive cleaning of exam room while observing appropriate contact time as indicated for disinfecting solutions.  Patient here for her physical exam.  She reports she is doing well.  Feels good.  Doing aerobics and yoga.  Also - line dancing.  No chest pain or sob with increased activity or exertion.  No cough or congestion.  No acid reflux.  No abdominal pain or cramping. Plays dominos.  Bowels stable.  Due to f/u with oncology 05/2020.  Mammogram scheduled.    Past Medical History:  Diagnosis Date  . Breast cancer ([place])    Dr Ewell Poe  . Hypercholesterolemia   . Osteopenia    Past Surgical History:  Procedure Laterality Date  . BREAST LUMPECTOMY  2007  . EYE SURGERY     detached retina   Family History  Problem Relation Age of Onset  . Hypertension Mother   . Hypercholesterolemia Mother   . Prostate cancer Father   . Breast cancer Maternal Aunt   . Melanoma Sister   . Vesicoureteral reflux Daughter   . Colon cancer Neg Hx    Social History   Socioeconomic History  . Marital status: Divorced    Spouse name: Not on file  . Number of children: 4  . Years of education: Not on file  . Highest education level: Not on file  Occupational History  . Occupation: retired  Tobacco Use  . Smoking status: Never Smoker  . Smokeless tobacco: Never Used  Vaping Use  . Vaping Use: Never used  Substance and Sexual Activity  . Alcohol use: Yes    Alcohol/week: 0.0 standard drinks    Comment: occasional  . Drug use: No  . Sexual activity: Not Currently    Birth control/protection: Post-menopausal    Other Topics Concern  . Not on file  Social History Narrative  . Not on file   Social Determinants of Health   Financial Resource Strain:   . Difficulty of Paying Living Expenses:   Food Insecurity:   . Worried About Charity fundraiser in the Last Year:   . Arboriculturist in the Last Year:   Transportation Needs:   . Film/video editor (Medical):   Marland Kitchen Lack of Transportation (Non-Medical):   Physical Activity:   . Days of Exercise per Week:   . Minutes of Exercise per Session:   Stress:   . Feeling of Stress :   Social Connections:   . Frequency of Communication with Friends and Family:   . Frequency of Social Gatherings with Friends and Family:   . Attends Religious Services:   . Active Member of Clubs or Organizations:   . Attends Archivist Meetings:   Marland Kitchen Marital Status:     Outpatient Encounter Medications as of 01/13/2020  Medication Sig  . calcium citrate-vitamin D 200-200 MG-UNIT TABS Take 1 tablet by mouth daily.  . cetirizine (ZYRTEC) 10 MG tablet Take 10 mg by mouth daily.  Marland Kitchen MELATONIN PO Take by mouth at bedtime as needed (sleep).  . Multiple Vitamins-Minerals (CENTRUM SILVER ADULT 50+ PO) Take  by mouth.  . Multiple Vitamins-Minerals (PRESERVISION AREDS 2) CAPS Take 2 capsules by mouth daily.   No facility-administered encounter medications on file as of 01/13/2020.    Review of Systems  Constitutional: Negative for appetite change and unexpected weight change.  HENT: Negative for congestion and sinus pressure.   Eyes: Negative for pain and visual disturbance.  Respiratory: Negative for cough, chest tightness and shortness of breath.   Cardiovascular: Negative for chest pain, palpitations and leg swelling.  Gastrointestinal: Negative for abdominal pain, diarrhea, nausea and vomiting.  Genitourinary: Negative for difficulty urinating and dysuria.  Musculoskeletal: Negative for joint swelling and myalgias.  Skin: Negative for color change and  rash.  Neurological: Negative for dizziness, light-headedness and headaches.  Hematological: Negative for adenopathy. Does not bruise/bleed easily.  Psychiatric/Behavioral: Negative for agitation and dysphoric mood.       Objective:    Physical Exam Vitals reviewed.  Constitutional:      General: She is not in acute distress.    Appearance: Normal appearance. She is well-developed.  HENT:     Head: Normocephalic and atraumatic.     Right Ear: External ear normal.     Left Ear: External ear normal.  Eyes:     General: No scleral icterus.       Right eye: No discharge.        Left eye: No discharge.     Conjunctiva/sclera: Conjunctivae normal.  Neck:     Thyroid: No thyromegaly.  Cardiovascular:     Rate and Rhythm: Normal rate and regular rhythm.  Pulmonary:     Effort: No tachypnea, accessory muscle usage or respiratory distress.     Breath sounds: Normal breath sounds. No decreased breath sounds or wheezing.  Chest:     Breasts:        Right: No inverted nipple, mass, nipple discharge or tenderness (no axillary adenopathy).        Left: No inverted nipple, mass, nipple discharge or tenderness (no axilarry adenopathy).  Abdominal:     General: Bowel sounds are normal.     Palpations: Abdomen is soft.     Tenderness: There is no abdominal tenderness.  Musculoskeletal:        General: No swelling or tenderness.     Cervical back: Neck supple. No tenderness.  Lymphadenopathy:     Cervical: No cervical adenopathy.  Skin:    Findings: No erythema or rash.  Neurological:     Mental Status: She is alert and oriented to person, place, and time.  Psychiatric:        Mood and Affect: Mood normal.        Behavior: Behavior normal.     BP 122/70   Pulse 73   Temp 98.1 F (36.7 C)   Resp 16   Ht 5\' 2"  (1.575 m)   Wt 138 lb (62.6 kg)   SpO2 98%   BMI 25.24 kg/m  Wt Readings from Last 3 Encounters:  01/13/20 138 lb (62.6 kg)  11/03/19 140 lb (63.5 kg)  07/08/19 138  lb 9.6 oz (62.9 kg)     Lab Results  Component Value Date   WBC 5.8 07/06/2019   HGB 13.9 07/06/2019   HCT 42.2 07/06/2019   PLT 248.0 07/06/2019   GLUCOSE 94 07/06/2019   CHOL 203 (H) 07/06/2019   TRIG 142.0 07/06/2019   HDL 61.00 07/06/2019   LDLDIRECT 152.4 08/16/2013   LDLCALC 114 (H) 07/06/2019   ALT 10 07/06/2019   AST  16 07/06/2019   NA 139 07/06/2019   K 4.6 07/06/2019   CL 104 07/06/2019   CREATININE 0.96 07/06/2019   BUN 15 07/06/2019   CO2 28 07/06/2019   TSH 3.29 09/20/2019       Assessment & Plan:   Problem List Items Addressed This Visit    Abnormal Pap smear of cervix    Saw gyn 12/2018.  Unable to do pap secondary to atrophy.  States they informed her no further w/up warranted.  Follow.        Anemia    S/p EGD and colonoscopy in 2015.  Follow cbc.       Health care maintenance    Physical today 01/13/20.  Mammogram 06/14/19- Birads I.  Colonoscopy 08/2013.  Recommended f/u colonoscopy in 10 years.       History of breast cancer    Mammogram 06/14/19 - Birads II.       Hypercholesterolemia    Have discussed calculated cholesterol risk. Have discussed starting a cholesterol medication.  She has declined.  Follow lipid panel.       Relevant Orders   TSH   Lipid panel   Hepatic function panel   Basic metabolic panel    Other Visit Diagnoses    Routine general medical examination at a health care facility    -  Primary       Einar Pheasant, MD

## 2020-01-23 ENCOUNTER — Encounter: Payer: Self-pay | Admitting: Internal Medicine

## 2020-01-23 NOTE — Assessment & Plan Note (Signed)
Mammogram 06/14/19 - Birads II.

## 2020-01-23 NOTE — Assessment & Plan Note (Signed)
Have discussed calculated cholesterol risk. Have discussed starting a cholesterol medication.  She has declined.  Follow lipid panel.

## 2020-01-23 NOTE — Assessment & Plan Note (Signed)
Saw gyn 12/2018.  Unable to do pap secondary to atrophy.  States they informed her no further w/up warranted.  Follow.

## 2020-01-23 NOTE — Assessment & Plan Note (Addendum)
Physical today 01/13/20.  Mammogram 06/14/19- Birads I.  Colonoscopy 08/2013.  Recommended f/u colonoscopy in 10 years.

## 2020-01-23 NOTE — Assessment & Plan Note (Signed)
S/p EGD and colonoscopy in 2015.  Follow cbc.

## 2020-02-14 ENCOUNTER — Ambulatory Visit: Payer: Medicare PPO | Attending: Internal Medicine

## 2020-02-14 DIAGNOSIS — Z23 Encounter for immunization: Secondary | ICD-10-CM

## 2020-02-14 NOTE — Progress Notes (Signed)
   Covid-19 Vaccination Clinic  Name:  AGNIESZKA NEWHOUSE    MRN: [id number] DOB: July 22, 1940  02/14/2020  Ms. Termine was observed post Covid-19 immunization for 15 minutes without incident. She was provided with Vaccine Information Sheet and instruction to access the V-Safe system.   Ms. Maranto was instructed to call 911 with any severe reactions post vaccine: Marland Kitchen Difficulty breathing  . Swelling of face and throat  . A fast heartbeat  . A bad rash all over body  . Dizziness and weakness

## 2020-04-10 DIAGNOSIS — H353132 Nonexudative age-related macular degeneration, bilateral, intermediate dry stage: Secondary | ICD-10-CM | POA: Diagnosis not present

## 2020-04-25 ENCOUNTER — Encounter: Payer: Self-pay | Admitting: Dermatology

## 2020-04-25 ENCOUNTER — Ambulatory Visit: Payer: Medicare PPO | Admitting: Dermatology

## 2020-04-25 ENCOUNTER — Other Ambulatory Visit: Payer: Self-pay

## 2020-04-25 DIAGNOSIS — L821 Other seborrheic keratosis: Secondary | ICD-10-CM

## 2020-04-25 DIAGNOSIS — L820 Inflamed seborrheic keratosis: Secondary | ICD-10-CM

## 2020-04-25 DIAGNOSIS — L578 Other skin changes due to chronic exposure to nonionizing radiation: Secondary | ICD-10-CM

## 2020-04-25 NOTE — Progress Notes (Signed)
   Follow-Up Visit   Subjective  Renee Vazquez is a 79 y.o. female who presents for the following: growths (L inframammary, pt noticed ~3wks ago, no symptoms).  The following portions of the chart were reviewed this encounter and updated as appropriate:  Tobacco  Allergies  Meds  Problems  Med Hx  Surg Hx  Fam Hx     Review of Systems:  No other skin or systemic complaints except as noted in HPI or Assessment and Plan.  Objective  Well appearing patient in no apparent distress; mood and affect are within normal limits.  A focused examination was performed including inframammary. Relevant physical exam findings are noted in the Assessment and Plan.  Objective  Left Inframammary x 17 (17): Erythematous keratotic or waxy stuck-on papule or plaque.    Assessment & Plan  Inflamed seborrheic keratosis (17) Left Inframammary x 17    Destruction of lesion - Left Inframammary x 17 Complexity: simple   Destruction method: cryotherapy   Informed consent: discussed and consent obtained   Timeout:  patient name, date of birth, surgical site, and procedure verified Lesion destroyed using liquid nitrogen: Yes   Region frozen until ice ball extended beyond lesion: Yes   Outcome: patient tolerated procedure well with no complications   Post-procedure details: wound care instructions given     Seborrheic Keratoses - Stuck-on, waxy, tan-brown papules and plaques  - Discussed benign etiology and prognosis. - Observe - Call for any changes  Actinic Damage -chronic - diffuse scaly erythematous macules with underlying dyspigmentation - Recommend daily broad spectrum sunscreen SPF 30+ to sun-exposed areas, reapply every 2 hours as needed.  - Call for new or changing lesions.  Return for as scheduled 08/02/2020 for [facility].  I, Othelia Pulling, RMA, am acting as scribe for Sarina Ser, MD .  Documentation: I have reviewed the above documentation for accuracy and  completeness, and I agree with the above.  Sarina Ser, MD

## 2020-04-27 ENCOUNTER — Encounter: Payer: Self-pay | Admitting: Internal Medicine

## 2020-04-29 NOTE — Telephone Encounter (Signed)
Of course!

## 2020-06-13 ENCOUNTER — Encounter: Payer: Self-pay | Admitting: Internal Medicine

## 2020-06-14 ENCOUNTER — Other Ambulatory Visit: Payer: Self-pay

## 2020-06-14 DIAGNOSIS — D649 Anemia, unspecified: Secondary | ICD-10-CM

## 2020-06-14 DIAGNOSIS — Z1231 Encounter for screening mammogram for malignant neoplasm of breast: Secondary | ICD-10-CM | POA: Diagnosis not present

## 2020-06-14 LAB — HM MAMMOGRAPHY

## 2020-06-14 NOTE — Telephone Encounter (Signed)
Patient stated that she had one stool a few days ago that looked a little discolored to her but no other acute symptoms noted and there were no more discolored stools after. Bowel movements are normal for her, no abdominal pain, nausea, vomiting, etc. She will be having routine blood work and an appointment with Dr Nicki Reaper. Requested to have a CBC done. She is ok to wait until her appointment in January. Patient is aware and agreeable to be evaluated sooner if symptoms occur again.

## 2020-07-17 ENCOUNTER — Other Ambulatory Visit: Payer: Self-pay

## 2020-07-17 ENCOUNTER — Other Ambulatory Visit (INDEPENDENT_AMBULATORY_CARE_PROVIDER_SITE_OTHER): Payer: Medicare PPO

## 2020-07-17 DIAGNOSIS — D649 Anemia, unspecified: Secondary | ICD-10-CM

## 2020-07-17 DIAGNOSIS — E7800 Pure hypercholesterolemia, unspecified: Secondary | ICD-10-CM | POA: Diagnosis not present

## 2020-07-17 LAB — CBC WITH DIFFERENTIAL/PLATELET
Basophils Absolute: 0.1 10*3/uL (ref 0.0–0.1)
Basophils Relative: 1 % (ref 0.0–3.0)
Eosinophils Absolute: 0.1 10*3/uL (ref 0.0–0.7)
Eosinophils Relative: 2.3 % (ref 0.0–5.0)
HCT: 41.2 % (ref 36.0–46.0)
Hemoglobin: 13.9 g/dL (ref 12.0–15.0)
Lymphocytes Relative: 38 % (ref 12.0–46.0)
Lymphs Abs: 2.2 10*3/uL (ref 0.7–4.0)
MCHC: 33.7 g/dL (ref 30.0–36.0)
MCV: 93.8 fl (ref 78.0–100.0)
Monocytes Absolute: 0.5 10*3/uL (ref 0.1–1.0)
Monocytes Relative: 9.3 % (ref 3.0–12.0)
Neutro Abs: 2.8 10*3/uL (ref 1.4–7.7)
Neutrophils Relative %: 49.4 % (ref 43.0–77.0)
Platelets: 247 10*3/uL (ref 150.0–400.0)
RBC: 4.39 Mil/uL (ref 3.87–5.11)
RDW: 13.4 % (ref 11.5–15.5)
WBC: 5.7 10*3/uL (ref 4.0–10.5)

## 2020-07-17 LAB — BASIC METABOLIC PANEL
BUN: 16 mg/dL (ref 6–23)
CO2: 28 mEq/L (ref 19–32)
Calcium: 9.3 mg/dL (ref 8.4–10.5)
Chloride: 106 mEq/L (ref 96–112)
Creatinine, Ser: 0.98 mg/dL (ref 0.40–1.20)
GFR: 55.17 mL/min — ABNORMAL LOW (ref >60.00)
Glucose, Bld: 90 mg/dL (ref 70–99)
Potassium: 4.1 mEq/L (ref 3.5–5.1)
Sodium: 140 mEq/L (ref 135–145)

## 2020-07-17 LAB — LIPID PANEL
Cholesterol: 196 mg/dL (ref 0–200)
HDL: 64.4 mg/dL (ref >39.00)
LDL Cholesterol: 113 mg/dL — ABNORMAL HIGH (ref 0–99)
NonHDL: 131.53
Total CHOL/HDL Ratio: 3
Triglycerides: 91 mg/dL (ref 0.0–149.0)
VLDL: 18.2 mg/dL (ref 0.0–40.0)

## 2020-07-17 LAB — HEPATIC FUNCTION PANEL
ALT: 12 U/L (ref 0–35)
AST: 17 U/L (ref 0–37)
Albumin: 3.8 g/dL (ref 3.5–5.2)
Alkaline Phosphatase: 57 U/L (ref 39–117)
Bilirubin, Direct: 0.1 mg/dL (ref 0.0–0.3)
Total Bilirubin: 0.7 mg/dL (ref 0.2–1.2)
Total Protein: 7.6 g/dL (ref 6.0–8.3)

## 2020-07-17 LAB — TSH: TSH: 5.13 u[IU]/mL — ABNORMAL HIGH (ref 0.35–4.50)

## 2020-07-19 ENCOUNTER — Ambulatory Visit: Payer: Medicare PPO | Admitting: Internal Medicine

## 2020-07-19 ENCOUNTER — Other Ambulatory Visit: Payer: Self-pay

## 2020-07-19 ENCOUNTER — Encounter: Payer: Self-pay | Admitting: Internal Medicine

## 2020-07-19 VITALS — BP 130/70 | HR 75 | Temp 98.1°F | Resp 16 | Ht 62.0 in | Wt 138.4 lb

## 2020-07-19 DIAGNOSIS — Z853 Personal history of malignant neoplasm of breast: Secondary | ICD-10-CM

## 2020-07-19 DIAGNOSIS — E7800 Pure hypercholesterolemia, unspecified: Secondary | ICD-10-CM

## 2020-07-19 DIAGNOSIS — D649 Anemia, unspecified: Secondary | ICD-10-CM | POA: Diagnosis not present

## 2020-07-19 DIAGNOSIS — R7989 Other specified abnormal findings of blood chemistry: Secondary | ICD-10-CM

## 2020-07-19 DIAGNOSIS — R87610 Atypical squamous cells of undetermined significance on cytologic smear of cervix (ASC-US): Secondary | ICD-10-CM | POA: Diagnosis not present

## 2020-07-19 NOTE — Progress Notes (Signed)
Patient ID: Renee Vazquez, female   DOB: Jan 03, 1941, 80 y.o.   MRN: [id number]   Subjective:    Patient ID: Renee Vazquez, female    DOB: 1940-12-13, 80 y.o.   MRN: [id number]  HPI This visit occurred during the SARS-CoV-2 public health emergency.  Safety protocols were in place, including screening questions prior to the visit, additional usage of staff PPE, and extensive cleaning of exam room while observing appropriate contact time as indicated for disinfecting solutions.  Patient here for a scheduled follow up. Here to follow up regarding her cholesterol.  Sheis doing well.  Has a history of breast cancer.  Just had mammogram in 05/2020 - ok.  Stays active.  Back to exercising - line dancing, yoga. No chest pain or sob with increased activity or exertion.  No acid reflux. No abdominal pain or cramping.  Bowels moving.     Past Medical History:  Diagnosis Date  . Breast cancer ([place])    Dr Ewell Poe  . Hypercholesterolemia   . Osteopenia    Past Surgical History:  Procedure Laterality Date  . BREAST LUMPECTOMY  2007  . EYE SURGERY     detached retina   Family History  Problem Relation Age of Onset  . Hypertension Mother   . Hypercholesterolemia Mother   . Prostate cancer Father   . Breast cancer Maternal Aunt   . Melanoma Sister   . Vesicoureteral reflux Daughter   . Colon cancer Neg Hx    Social History   Socioeconomic History  . Marital status: Divorced    Spouse name: Not on file  . Number of children: 4  . Years of education: Not on file  . Highest education level: Not on file  Occupational History  . Occupation: retired  Tobacco Use  . Smoking status: Never Smoker  . Smokeless tobacco: Never Used  Vaping Use  . Vaping Use: Never used  Substance and Sexual Activity  . Alcohol use: Yes    Alcohol/week: 0.0 standard drinks    Comment: occasional  . Drug use: No  . Sexual activity: Not Currently    Birth control/protection:  Post-menopausal  Other Topics Concern  . Not on file  Social History Narrative  . Not on file   Social Determinants of Health   Financial Resource Strain: Not on file  Food Insecurity: Not on file  Transportation Needs: Not on file  Physical Activity: Not on file  Stress: Not on file  Social Connections: Not on file    Outpatient Encounter Medications as of 07/19/2020  Medication Sig  . calcium citrate-vitamin D 200-200 MG-UNIT TABS Take 1 tablet by mouth daily.  . cetirizine (ZYRTEC) 10 MG tablet Take 10 mg by mouth daily.  Marland Kitchen MELATONIN PO Take by mouth at bedtime as needed (sleep).  . Multiple Vitamins-Minerals (CENTRUM SILVER ADULT 50+ PO) Take by mouth.  . Multiple Vitamins-Minerals (PRESERVISION AREDS 2) CAPS Take 2 capsules by mouth daily.   No facility-administered encounter medications on file as of 07/19/2020.    Review of Systems  Constitutional: Negative for appetite change and unexpected weight change.  HENT: Negative for congestion and sinus pressure.   Respiratory: Negative for cough, chest tightness and shortness of breath.   Cardiovascular: Negative for chest pain, palpitations and leg swelling.  Gastrointestinal: Negative for abdominal pain, diarrhea, nausea and vomiting.  Genitourinary: Negative for difficulty urinating and dysuria.  Musculoskeletal: Negative for joint swelling and myalgias.  Skin: Negative for color change and  rash.  Neurological: Negative for dizziness, light-headedness and headaches.  Psychiatric/Behavioral: Negative for agitation and dysphoric mood.       Objective:    Physical Exam Vitals reviewed.  Constitutional:      General: She is not in acute distress.    Appearance: Normal appearance.  HENT:     Head: Normocephalic and atraumatic.     Right Ear: External ear normal.     Left Ear: External ear normal.     Mouth/Throat:     Mouth: Oropharynx is clear and moist.  Eyes:     General: No scleral icterus.       Right eye: No  discharge.        Left eye: No discharge.     Conjunctiva/sclera: Conjunctivae normal.  Neck:     Thyroid: No thyromegaly.  Cardiovascular:     Rate and Rhythm: Normal rate and regular rhythm.  Pulmonary:     Effort: No respiratory distress.     Breath sounds: Normal breath sounds. No wheezing.  Abdominal:     General: Bowel sounds are normal.     Palpations: Abdomen is soft.     Tenderness: There is no abdominal tenderness.  Musculoskeletal:        General: No swelling, tenderness or edema.     Cervical back: Neck supple. No tenderness.  Lymphadenopathy:     Cervical: No cervical adenopathy.  Skin:    Findings: No erythema or rash.  Neurological:     Mental Status: She is alert.  Psychiatric:        Mood and Affect: Mood normal.        Behavior: Behavior normal.     BP 130/70   Pulse 75   Temp 98.1 F (36.7 C) (Oral)   Resp 16   Ht 5\' 2"  (1.575 m)   Wt 138 lb 6.4 oz (62.8 kg)   SpO2 98%   BMI 25.31 kg/m  Wt Readings from Last 3 Encounters:  07/19/20 138 lb 6.4 oz (62.8 kg)  01/13/20 138 lb (62.6 kg)  11/03/19 140 lb (63.5 kg)     Lab Results  Component Value Date   WBC 5.7 07/17/2020   HGB 13.9 07/17/2020   HCT 41.2 07/17/2020   PLT 247.0 07/17/2020   GLUCOSE 90 07/17/2020   CHOL 196 07/17/2020   TRIG 91.0 07/17/2020   HDL 64.40 07/17/2020   LDLDIRECT 152.4 08/16/2013   LDLCALC 113 (H) 07/17/2020   ALT 12 07/17/2020   AST 17 07/17/2020   NA 140 07/17/2020   K 4.1 07/17/2020   CL 106 07/17/2020   CREATININE 0.98 07/17/2020   BUN 16 07/17/2020   CO2 28 07/17/2020   TSH 5.13 (H) 07/17/2020       Assessment & Plan:   Problem List Items Addressed This Visit    Abnormal Pap smear of cervix    Saw gyn 12/2018.  Unable to do pap secondary to atrophy. States informed w/up no further w/up warranted.  Follow.       Anemia    Follow cbc.       Elevated TSH - Primary    Slightly elevated tsh on recheck lab check.  Recheck tsh in 8 weeks.        Relevant Orders   TSH   History of breast cancer    Mammogram 05/2020 - ok.  Follow.       Hypercholesterolemia    The 10-year ASCVD risk score Mikey Bussing DC Brooke Bonito., et al.,  2013) is: 22.9%   Values used to calculate the score:     Age: 80 years     Sex: Female     Is Non-Hispanic African American: No     Diabetic: No     Tobacco smoker: No     Systolic Blood Pressure: 950 mmHg     Is BP treated: No     HDL Cholesterol: 64.4 mg/dL     Total Cholesterol: 196 mg/dL   Have discussed calculated cholesterol risk.  Discussed starting cholesterol medication.  She declines.  Low cholesterol diet and exercise.  Follow lipid panel.           Einar Pheasant, MD

## 2020-07-24 ENCOUNTER — Encounter: Payer: Self-pay | Admitting: Internal Medicine

## 2020-07-24 NOTE — Assessment & Plan Note (Signed)
The 10-year ASCVD risk score Mikey Bussing DC Brooke Bonito., et al., 2013) is: 22.9%   Values used to calculate the score:     Age: 80 years     Sex: Female     Is Non-Hispanic African American: No     Diabetic: No     Tobacco smoker: No     Systolic Blood Pressure: 500 mmHg     Is BP treated: No     HDL Cholesterol: 64.4 mg/dL     Total Cholesterol: 196 mg/dL   Have discussed calculated cholesterol risk.  Discussed starting cholesterol medication.  She declines.  Low cholesterol diet and exercise.  Follow lipid panel.

## 2020-07-24 NOTE — Assessment & Plan Note (Signed)
Mammogram 05/2020 - ok.  Follow.

## 2020-07-24 NOTE — Assessment & Plan Note (Signed)
Slightly elevated tsh on recheck lab check.  Recheck tsh in 8 weeks.

## 2020-07-24 NOTE — Assessment & Plan Note (Signed)
Saw gyn 12/2018.  Unable to do pap secondary to atrophy. States informed w/up no further w/up warranted.  Follow.

## 2020-07-24 NOTE — Assessment & Plan Note (Signed)
Follow cbc.  

## 2020-08-02 ENCOUNTER — Other Ambulatory Visit: Payer: Self-pay

## 2020-08-02 ENCOUNTER — Ambulatory Visit: Payer: Medicare PPO | Admitting: Dermatology

## 2020-08-02 DIAGNOSIS — D229 Melanocytic nevi, unspecified: Secondary | ICD-10-CM | POA: Diagnosis not present

## 2020-08-02 DIAGNOSIS — L820 Inflamed seborrheic keratosis: Secondary | ICD-10-CM | POA: Diagnosis not present

## 2020-08-02 DIAGNOSIS — L821 Other seborrheic keratosis: Secondary | ICD-10-CM

## 2020-08-02 DIAGNOSIS — D1800 Hemangioma unspecified site: Secondary | ICD-10-CM

## 2020-08-02 DIAGNOSIS — Z1283 Encounter for screening for malignant neoplasm of skin: Secondary | ICD-10-CM

## 2020-08-02 DIAGNOSIS — L570 Actinic keratosis: Secondary | ICD-10-CM

## 2020-08-02 DIAGNOSIS — L814 Other melanin hyperpigmentation: Secondary | ICD-10-CM | POA: Diagnosis not present

## 2020-08-02 DIAGNOSIS — L578 Other skin changes due to chronic exposure to nonionizing radiation: Secondary | ICD-10-CM | POA: Diagnosis not present

## 2020-08-02 NOTE — Patient Instructions (Signed)

## 2020-08-02 NOTE — Progress Notes (Signed)
   Follow-Up Visit   Subjective  Renee Vazquez is a 80 y.o. female who presents for the following: Annual Exam (History of Aks - TBSE today) and Other (Spots of left face and left back that get irritated). The patient presents for Total-Body Skin Exam (TBSE) for skin cancer screening and mole check.'  The following portions of the chart were reviewed this encounter and updated as appropriate:   Tobacco  Allergies  Meds  Problems  Med Hx  Surg Hx  Fam Hx     Review of Systems:  No other skin or systemic complaints except as noted in HPI or Assessment and Plan.  Objective  Well appearing patient in no apparent distress; mood and affect are within normal limits.  A full examination was performed including scalp, head, eyes, ears, nose, lips, neck, chest, axillae, abdomen, back, buttocks, bilateral upper extremities, bilateral lower extremities, hands, feet, fingers, toes, fingernails, and toenails. All findings within normal limits unless otherwise noted below.  Objective  Left wrist x 1, nose x 1, left cheek x 3 (5): Erythematous thin papules/macules with gritty scale.   Objective  Left sup buttock: Erythematous keratotic or waxy stuck-on papule or plaque.    Assessment & Plan    Lentigines - Scattered tan macules - Discussed due to sun exposure - Benign, observe - Call for any changes  Seborrheic Keratoses - Stuck-on, waxy, tan-brown papules and plaques  - Discussed benign etiology and prognosis. - Observe - Call for any changes  Melanocytic Nevi - Tan-brown and/or pink-flesh-colored symmetric macules and papules - Benign appearing on exam today - Observation - Call clinic for new or changing moles - Recommend daily use of broad spectrum spf 30+ sunscreen to sun-exposed areas.   Hemangiomas - Red papules - Discussed benign nature - Observe - Call for any changes  Actinic Damage - Chronic, secondary to cumulative UV/sun exposure - diffuse  scaly erythematous macules with underlying dyspigmentation - Recommend daily broad spectrum sunscreen SPF 30+ to sun-exposed areas, reapply every 2 hours as needed.  - Call for new or changing lesions.  Skin cancer screening performed today.  AK (actinic keratosis) (5) Left wrist x 1, nose x 1, left cheek x 3  Destruction of lesion - Left wrist x 1, nose x 1, left cheek x 3 Complexity: simple   Destruction method: cryotherapy   Informed consent: discussed and consent obtained   Timeout:  patient name, date of birth, surgical site, and procedure verified Lesion destroyed using liquid nitrogen: Yes   Region frozen until ice ball extended beyond lesion: Yes   Outcome: patient tolerated procedure well with no complications   Post-procedure details: wound care instructions given    Inflamed seborrheic keratosis Left sup buttock  Destruction of lesion - Left sup buttock Complexity: simple   Destruction method: cryotherapy   Informed consent: discussed and consent obtained   Timeout:  patient name, date of birth, surgical site, and procedure verified Lesion destroyed using liquid nitrogen: Yes   Region frozen until ice ball extended beyond lesion: Yes   Outcome: patient tolerated procedure well with no complications   Post-procedure details: wound care instructions given    Skin cancer screening  Return in about 1 year (around 08/02/2021) for TBSE.  I, Ashok Cordia, CMA, am acting as scribe for Sarina Ser, MD .  Documentation: I have reviewed the above documentation for accuracy and completeness, and I agree with the above.  Sarina Ser, MD

## 2020-08-05 ENCOUNTER — Encounter: Payer: Self-pay | Admitting: Dermatology

## 2020-08-17 DIAGNOSIS — H353132 Nonexudative age-related macular degeneration, bilateral, intermediate dry stage: Secondary | ICD-10-CM | POA: Diagnosis not present

## 2020-08-17 DIAGNOSIS — H2513 Age-related nuclear cataract, bilateral: Secondary | ICD-10-CM | POA: Diagnosis not present

## 2020-09-03 ENCOUNTER — Encounter: Payer: Self-pay | Admitting: Internal Medicine

## 2020-09-04 ENCOUNTER — Telehealth (INDEPENDENT_AMBULATORY_CARE_PROVIDER_SITE_OTHER): Payer: Medicare PPO | Admitting: Internal Medicine

## 2020-09-04 ENCOUNTER — Other Ambulatory Visit: Payer: Self-pay

## 2020-09-04 ENCOUNTER — Other Ambulatory Visit: Payer: Medicare PPO

## 2020-09-04 DIAGNOSIS — R059 Cough, unspecified: Secondary | ICD-10-CM

## 2020-09-04 MED ORDER — CHERATUSSIN AC 100-10 MG/5ML PO SOLN: ORAL | 0 refills | Status: DC

## 2020-09-04 MED ORDER — PREDNISONE 10 MG PO TABS: ORAL_TABLET | ORAL | 0 refills | Status: DC

## 2020-09-04 NOTE — Telephone Encounter (Signed)
Renee Vazquez sent a my chart message on Saturday regarding sore throat and cough. Please call and see how she is doing today.  Any chest tightness, sob,vomiting, nausea, etc.  Was her covid test PCR test?  Ok to schedule virtual visit, but is any acute symptoms needs to be seen immediately.

## 2020-09-04 NOTE — Telephone Encounter (Signed)
Pt scheduled for virtual visit, confirmed nothing acute. Only symptoms were cough and congestion, mild sore throat. Test was a home test.

## 2020-09-04 NOTE — Progress Notes (Signed)
Patient ID: Renee Vazquez, female   DOB: [birth year], 80 y.o.   MRN: [id number]   Virtual Visit via telephone Note  This visit type was conducted due to national recommendations for restrictions regarding the COVID-19 pandemic (e.g. social distancing).  This format is felt to be most appropriate for this patient at this time.  All issues noted in this document were discussed and addressed.  No physical exam was performed (except for noted visual exam findings with Video Visits).   I connected with [name] by telephone and verified that I am speaking with the correct person using two identifiers. Location patient: home Location provider: work Persons participating in the telephone visit: patient, provider  The limitations, risks, security and privacy concerns of performing an evaluation and management service by telephone and the availability of in person appointments have been discussed. It has also been discussed with the patient that there may be a patient responsible charge related to this service. The patient expressed understanding and agreed to proceed.   Reason for visit: work in appt  HPI: Work in appt for sore throat, cough and congestion.  States symptoms started 09/02/20 - sore throat and cough.  Home covid test negative.  First developed mild sore throat - has progressively worsened.  Felt like something stuck in her throat.  Right gland feels swollen.  Increased cough - affecting her sleep.  No earache.  Increased nasal congestion and sinus pressure.  Some increased drainage.  Cough is mostly non productive.  Some coughing fits.  No sob.  No vomiting or diarrhea.  Does report decreased appetite.  Just started robitussin.  Took melatonin last night to help sleep.    ROS: See pertinent positives and negatives per HPI.  Past Medical History:  Diagnosis Date  . Breast cancer ([place])    Dr Ewell Poe  . Hypercholesterolemia   . Osteopenia     Past Surgical  History:  Procedure Laterality Date  . BREAST LUMPECTOMY  2007  . EYE SURGERY     detached retina    Family History  Problem Relation Age of Onset  . Hypertension Mother   . Hypercholesterolemia Mother   . Prostate cancer Father   . Breast cancer Maternal Aunt   . Melanoma Sister   . Vesicoureteral reflux Daughter   . Colon cancer Neg Hx     SOCIAL HX: reviewed.    Current Outpatient Medications:  .  guaiFENesin-codeine (CHERATUSSIN AC) 100-10 MG/5ML syrup, 1/2 - 1 teaspoon q hs prn, Disp: 120 mL, Rfl: 0 .  predniSONE (DELTASONE) 10 MG tablet, Take 6 tablets x 1 day and then decrease by 1/2 tablet per day until down to zero mg., Disp: 39 tablet, Rfl: 0 .  calcium citrate-vitamin D 200-200 MG-UNIT TABS, Take 1 tablet by mouth daily., Disp: , Rfl:  .  cetirizine (ZYRTEC) 10 MG tablet, Take 10 mg by mouth daily., Disp: , Rfl:  .  MELATONIN PO, Take by mouth at bedtime as needed (sleep)., Disp: , Rfl:  .  Multiple Vitamins-Minerals (CENTRUM SILVER ADULT 50+ PO), Take by mouth., Disp: , Rfl:  .  Multiple Vitamins-Minerals (PRESERVISION AREDS 2) CAPS, Take 2 capsules by mouth daily., Disp: , Rfl:   EXAM:  GENERAL: alert. Sounds to be in no acute distress.  Answering questions appropriately.   PSYCH/NEURO: pleasant and cooperative, no obvious depression or anxiety, speech and thought processing grossly intact  ASSESSMENT AND PLAN:  Discussed the following assessment and plan:  Problem List  Items Addressed This Visit    Cough    Increased cough and congestion as outlined.  Describes nasal congestion, drainage and coughing fits.  Discussed covid.  Schedule for nasal swab.  Discussed quarantine guidelines.  nasacort and saline nasal spray as directed.  Prednisone taper as directed.  cheratussin to help with increased cough at night so she can sleep.  Rest.  Fluids.  Call with update.       Relevant Orders   Novel Coronavirus, NAA ([facility]) (Completed)       I discussed the  assessment and treatment plan with the patient. The patient was provided an opportunity to ask questions and all were answered. The patient agreed with the plan and demonstrated an understanding of the instructions.   The patient was advised to call back or seek an in-person evaluation if the symptoms worsen or if the condition fails to improve as anticipated.  I provided 23 minutes of non-face-to-face time during this encounter.   Einar Pheasant, MD

## 2020-09-05 LAB — NOVEL CORONAVIRUS, NAA: SARS-CoV-2, NAA: NOT DETECTED

## 2020-09-05 LAB — SARS-COV-2, NAA 2 DAY TAT

## 2020-09-11 ENCOUNTER — Encounter: Payer: Self-pay | Admitting: Internal Medicine

## 2020-09-11 NOTE — Assessment & Plan Note (Signed)
Increased cough and congestion as outlined.  Describes nasal congestion, drainage and coughing fits.  Discussed covid.  Schedule for nasal swab.  Discussed quarantine guidelines.  nasacort and saline nasal spray as directed.  Prednisone taper as directed.  cheratussin to help with increased cough at night so she can sleep.  Rest.  Fluids.  Call with update.

## 2020-09-18 ENCOUNTER — Other Ambulatory Visit: Payer: Self-pay

## 2020-09-18 ENCOUNTER — Other Ambulatory Visit (INDEPENDENT_AMBULATORY_CARE_PROVIDER_SITE_OTHER): Payer: Medicare PPO

## 2020-09-18 DIAGNOSIS — R7989 Other specified abnormal findings of blood chemistry: Secondary | ICD-10-CM

## 2020-09-18 LAB — TSH: TSH: 3.21 u[IU]/mL (ref 0.35–4.50)

## 2020-10-02 ENCOUNTER — Encounter: Payer: Self-pay | Admitting: Internal Medicine

## 2020-10-03 NOTE — Telephone Encounter (Signed)
It is ok to take robitussin, but if she is having increase pain in her ear and worsening symptoms, needs to be evaluated.

## 2020-10-03 NOTE — Telephone Encounter (Signed)
Patient aware of below. Using robitussin. Ear pain is gone. Will call if she has any problems.

## 2020-11-03 ENCOUNTER — Ambulatory Visit (INDEPENDENT_AMBULATORY_CARE_PROVIDER_SITE_OTHER): Payer: Medicare PPO

## 2020-11-03 VITALS — Ht 62.0 in | Wt 138.0 lb

## 2020-11-03 DIAGNOSIS — Z0000 Encounter for general adult medical examination without abnormal findings: Secondary | ICD-10-CM

## 2020-11-03 NOTE — Progress Notes (Signed)
Subjective:   Renee Vazquez is a 80 y.o. female who presents for Medicare Annual (Subsequent) preventive examination.  Review of Systems    No ROS.  Medicare Wellness Virtual Visit.   Cardiac Risk Factors include: advanced age (>82men, >46 women)     Objective:    Today's Vitals   11/03/20 1128  Weight: 138 lb (62.6 kg)  Height: 5\' 2"  (1.575 m)   Body mass index is 25.24 kg/m.  Advanced Directives 11/03/2020 11/03/2019 11/02/2018 09/18/2017 09/17/2016 09/18/2015  Does Patient Have a Medical Advance Directive? Yes Yes Yes Yes Yes Yes  Type of Paramedic of [place];Living will [place];Living will  Does patient want to make changes to medical advance directive? No - Patient declined No - Patient declined No - Patient declined No - Patient declined No - Patient declined -  Copy of [place] in Chart? No - copy requested No - copy requested No - copy requested No - copy requested No - copy requested No - copy requested    Current Medications (verified) Outpatient Encounter Medications as of 11/03/2020  Medication Sig  . calcium citrate-vitamin D 200-200 MG-UNIT TABS Take 1 tablet by mouth daily.  . cetirizine (ZYRTEC) 10 MG tablet Take 10 mg by mouth daily.  Marland Kitchen MELATONIN PO Take by mouth at bedtime as needed (sleep).  . Multiple Vitamins-Minerals (CENTRUM SILVER ADULT 50+ PO) Take by mouth.  . Multiple Vitamins-Minerals (PRESERVISION AREDS 2) CAPS Take 2 capsules by mouth daily.  . [DISCONTINUED] guaiFENesin-codeine (CHERATUSSIN AC) 100-10 MG/5ML syrup 1/2 - 1 teaspoon q hs prn  . [DISCONTINUED] predniSONE (DELTASONE) 10 MG tablet Take 6 tablets x 1 day and then decrease by 1/2 tablet per day until down to zero mg.   No facility-administered encounter medications on  file as of 11/03/2020.    Allergies (verified) No known drug allergy   History: Past Medical History:  Diagnosis Date  . Breast cancer ([place])    Dr Ewell Poe  . Hypercholesterolemia   . Osteopenia    Past Surgical History:  Procedure Laterality Date  . BREAST LUMPECTOMY  2007  . EYE SURGERY     detached retina   Family History  Problem Relation Age of Onset  . Hypertension Mother   . Hypercholesterolemia Mother   . Prostate cancer Father   . Breast cancer Maternal Aunt   . Melanoma Sister   . Vesicoureteral reflux Daughter   . Colon cancer Neg Hx    Social History   Socioeconomic History  . Marital status: Divorced    Spouse name: Not on file  . Number of children: 4  . Years of education: Not on file  . Highest education level: Not on file  Occupational History  . Occupation: retired  Tobacco Use  . Smoking status: Never Smoker  . Smokeless tobacco: Never Used  Vaping Use  . Vaping Use: Never used  Substance and Sexual Activity  . Alcohol use: Yes    Alcohol/week: 0.0 standard drinks    Comment: occasional  . Drug use: No  . Sexual activity: Not Currently    Birth control/protection: Post-menopausal  Other Topics Concern  . Not on file  Social History Narrative  . Not on file   Social Determinants of Health   Financial Resource Strain: Low Risk   . Difficulty of Paying  Living Expenses: Not hard at all  Food Insecurity: No Food Insecurity  . Worried About Charity fundraiser in the Last Year: Never true  . Ran Out of Food in the Last Year: Never true  Transportation Needs: No Transportation Needs  . Lack of Transportation (Medical): No  . Lack of Transportation (Non-Medical): No  Physical Activity: Sufficiently Active  . Days of Exercise per Week: 7 days  . Minutes of Exercise per Session: 150+ min  Stress: No Stress Concern Present  . Feeling of Stress : Not at all  Social Connections: Unknown  . Frequency of Communication with  Friends and Family: More than three times a week  . Frequency of Social Gatherings with Friends and Family: More than three times a week  . Attends Religious Services: Not on file  . Active Member of Clubs or Organizations: Not on file  . Attends Archivist Meetings: Not on file  . Marital Status: Not on file    Tobacco Counseling Counseling given: Not Answered   Clinical Intake:  Pre-visit preparation completed: Yes        Diabetes: No  How often do you need to have someone help you when you read instructions, pamphlets, or other written materials from your doctor or pharmacy?: 1 - Never    Interpreter Needed?: No      Activities of Daily Living In your present state of health, do you have any difficulty performing the following activities: 11/03/2020  Hearing? N  Vision? N  Difficulty concentrating or making decisions? N  Walking or climbing stairs? N  Dressing or bathing? N  Doing errands, shopping? N  Preparing Food and eating ? N  Using the Toilet? N  In the past six months, have you accidently leaked urine? N  Do you have problems with loss of bowel control? N  Managing your Medications? N  Managing your Finances? N  Housekeeping or managing your Housekeeping? N  Some recent data might be hidden    Patient Care Team: Einar Pheasant, MD as PCP - General (Internal Medicine)  Indicate any recent Medical Services you may have received from other than [facility] providers in the past year (date may be approximate).     Assessment:   This is a routine wellness examination for [place].  I connected with Shatira today by telephone and verified that I am speaking with the correct person using two identifiers. Location patient: home Location provider: work Persons participating in the virtual visit: patient, Marine scientist.    I discussed the limitations, risks, security and privacy concerns of performing an evaluation and management service by telephone and  the availability of in person appointments. The patient expressed understanding and verbally consented to this telephonic visit.    Interactive audio and video telecommunications were attempted between this provider and patient, however failed, due to patient having technical difficulties OR patient did not have access to video capability.  We continued and completed visit with audio only.  Some vital signs may be absent or patient reported.   Hearing/Vision screen  Hearing Screening   125Hz  250Hz  500Hz  1000Hz  2000Hz  3000Hz  4000Hz  6000Hz  8000Hz   Right ear:           Left ear:           Comments: Patient is able to hear conversational tones without difficulty. No issues reported.   Vision Screening Comments: Followed by [facility]  Wears corrective lenses when driving  Hx of detached retina  Taking  Preservation Areds 2  Dietary issues and exercise activities discussed: Current Exercise Habits: Home exercise routine, Time (Minutes): 60, Frequency (Times/Week): 4, Weekly Exercise (Minutes/Week): 240, Intensity: Mild  Healthy diet Good water intake  Goals Addressed              This Visit's Progress     Patient Stated   .  Increase physical activity (pt-stated)   On track     Resume dancing classes Add Yoga classes Weight goal about 137lb      Depression Screen PHQ 2/9 Scores 11/03/2020 09/04/2020 11/03/2019 11/02/2018 09/18/2017 09/17/2016 04/04/2016  PHQ - 2 Score 0 0 0 0 0 0 0    Fall Risk Fall Risk  11/03/2020 09/04/2020 11/03/2019 07/08/2019 11/02/2018  Falls in the past year? 0 0 0 0 0  Number falls in past yr: 0 - - - -  Injury with Fall? 0 - - - -  Follow up Falls evaluation completed - Falls evaluation completed Falls evaluation completed -    FALL RISK PREVENTION PERTAINING TO THE HOME: Handrails in use when climbing stairs?  Home free of loose throw rugs in walkways, pet beds, electrical cords, etc? Yes  Adequate lighting in your home to reduce risk of  falls? Yes   ASSISTIVE DEVICES UTILIZED TO PREVENT FALLS: Use of a cane, walker or w/c? No   TIMED UP AND GO: Was the test performed? No .   Cognitive Function: Patient is alert and oriented x3. Denies difficulty focusing, making decisions, memory loss.  MMSE/6CIT declined. Normal by direct communication/observation.   MMSE - Mini Mental State Exam 11/03/2019 09/17/2016 09/18/2015  Not completed: Unable to complete - -  Orientation to time - 5 5  Orientation to Place - 5 5  Registration - 3 3  Attention/ Calculation - 5 5  Recall - 3 3  Language- name 2 objects - 2 2  Language- repeat - 1 1  Language- follow 3 step command - 3 3  Language- read & follow direction - 1 1  Write a sentence - 1 1  Copy design - 1 1  Total score - 30 30     6CIT Screen 11/02/2018 09/18/2017  What Year? 0 points 0 points  What month? 0 points 0 points  What time? 0 points 0 points  Count back from 20 0 points 0 points  Months in reverse 0 points 0 points  Repeat phrase 0 points 0 points  Total Score 0 0    Immunizations Immunization History  Administered Date(s) Administered  . Influenza Split 04/15/2014  . Influenza, High Dose Seasonal PF 04/11/2018  . Influenza-Unspecified 04/06/2015, 04/22/2016, 03/24/2017, 03/29/2019, 03/24/2020  . PFIZER(Purple Top)SARS-COV-2 Vaccination 06/30/2019, 07/21/2019, 02/14/2020  . Pneumococcal Conjugate-13 03/11/2014, 03/06/2018  . Pneumococcal Polysaccharide-23 09/13/2015  . Tdap 12/16/2014  . Zoster 08/17/2012   Health Maintenance Health Maintenance  Topic Date Due  . Hepatitis C Screening  Never done  . COVID-19 Vaccine (4 - Booster for [place] series) 11/19/2020 (Originally 05/16/2020)  . DEXA SCAN  11/03/2021 (Originally 09/27/2006)  . INFLUENZA VACCINE  01/22/2021  . TETANUS/TDAP  12/15/2024  . PNA vac Low Risk Adult  Completed  . HPV VACCINES  Aged Out   Colorectal cancer screening: Type of screening: Colonoscopy. Completed 09/07/13. Repeat every  10 years  Mammogram status: Completed 06/14/20. Repeat every year  Bone density- declined per patient preference.   Lung Cancer Screening: (Low Dose CT Chest recommended if Age 86-80 years, 30 pack-year currently smoking OR  have quit w/in 15years.) does not qualify.   Hepatitis C Screening: Consent given to add future order.   Vision Screening: Recommended annual ophthalmology exams for early detection of glaucoma and other disorders of the eye. Is the patient up to date with their annual eye exam?  Yes   Dental Screening: Recommended annual dental exams for proper oral hygiene.  Community Resource Referral / Chronic Care Management: CRR required this visit?  No   CCM required this visit?  No      Plan:   Keep all routine maintenance appointments.   I have personally reviewed and noted the following in the patient's chart:   . Medical and social history . Use of alcohol, tobacco or illicit drugs  . Current medications and supplements including opioid prescriptions. Patient is not taking opioid prescriptions.  . Functional ability and status . Nutritional status . Physical activity . Advanced directives . List of other physicians . Hospitalizations, surgeries, and ER visits in previous 12 months . Vitals . Screenings to include cognitive, depression, and falls . Referrals and appointments  In addition, I have reviewed and discussed with patient certain preventive protocols, quality metrics, and best practice recommendations. A written personalized care plan for preventive services as well as general preventive health recommendations were provided to patient via mychart.     Varney Biles, LPN   [id number]

## 2020-11-03 NOTE — Patient Instructions (Addendum)
Renee Vazquez , Thank you for taking time to come for your Medicare Wellness Visit. I appreciate your ongoing commitment to your health goals. Please review the following plan we discussed and let me know if I can assist you in the future.   These are the goals we discussed: Goals      Patient Stated   .  Increase physical activity (pt-stated)      Resume dancing classes Add Yoga classes Weight goal about 137lb       This is a list of the screening recommended for you and due dates:  Health Maintenance  Topic Date Due  . Hepatitis C Screening: USPSTF Recommendation to screen - Ages 18-79 yo.  Never done  . COVID-19 Vaccine (4 - Booster for Pfizer series) 11/19/2020*  . DEXA scan (bone density measurement)  11/03/2021*  . Flu Shot  01/22/2021  . Tetanus Vaccine  12/15/2024  . Pneumonia vaccines  Completed  . HPV Vaccine  Aged Out  *Topic was postponed. The date shown is not the original due date.    Immunizations Immunization History  Administered Date(s) Administered  . Influenza Split 04/15/2014  . Influenza, High Dose Seasonal PF 04/11/2018  . Influenza-Unspecified 04/06/2015, 04/22/2016, 03/24/2017, 03/29/2019, 03/24/2020  . PFIZER(Purple Top)SARS-COV-2 Vaccination 06/30/2019, 07/21/2019, 02/14/2020  . Pneumococcal Conjugate-13 03/11/2014, 03/06/2018  . Pneumococcal Polysaccharide-23 09/13/2015  . Tdap 12/16/2014  . Zoster 08/17/2012   Advanced directives: End of life planning; Advance aging; Advanced directives discussed.  Copy of current HCPOA/Living Will requested.    Conditions/risks identified: none new  Follow up in one year for your annual wellness visit   Preventive Care 65 Years and Older, Female Preventive care refers to lifestyle choices and visits with your health care provider that can promote health and wellness. What does preventive care include?  A yearly physical exam. This is also called an annual well check.  Dental exams once or twice a  year.  Routine eye exams. Ask your health care provider how often you should have your eyes checked.  Personal lifestyle choices, including:  Daily care of your teeth and gums.  Regular physical activity.  Eating a healthy diet.  Avoiding tobacco and drug use.  Limiting alcohol use.  Practicing safe sex.  Taking low-dose aspirin every day.  Taking vitamin and mineral supplements as recommended by your health care provider. What happens during an annual well check? The services and screenings done by your health care provider during your annual well check will depend on your age, overall health, lifestyle risk factors, and family history of disease. Counseling  Your health care provider may ask you questions about your:  Alcohol use.  Tobacco use.  Drug use.  Emotional well-being.  Home and relationship well-being.  Sexual activity.  Eating habits.  History of falls.  Memory and ability to understand (cognition).  Work and work Statistician.  Reproductive health. Screening  You may have the following tests or measurements:  Height, weight, and BMI.  Blood pressure.  Lipid and cholesterol levels. These may be checked every 5 years, or more frequently if you are over 50 years old.  Skin check.  Lung cancer screening. You may have this screening every year starting at age 20 if you have a 30-pack-year history of smoking and currently smoke or have quit within the past 15 years.  Fecal occult blood test (FOBT) of the stool. You may have this test every year starting at age 19.  Flexible sigmoidoscopy or colonoscopy. You may  have a sigmoidoscopy every 5 years or a colonoscopy every 10 years starting at age 62.  Hepatitis C blood test.  Hepatitis B blood test.  Sexually transmitted disease (STD) testing.  Diabetes screening. This is done by checking your blood sugar (glucose) after you have not eaten for a while (fasting). You may have this done every 1-3  years.  Bone density scan. This is done to screen for osteoporosis. You may have this done starting at age 65.  Mammogram. This may be done every 1-2 years. Talk to your health care provider about how often you should have regular mammograms. Talk with your health care provider about your test results, treatment options, and if necessary, the need for more tests. Vaccines  Your health care provider may recommend certain vaccines, such as:  Influenza vaccine. This is recommended every year.  Tetanus, diphtheria, and acellular pertussis (Tdap, Td) vaccine. You may need a Td booster every 10 years.  Zoster vaccine. You may need this after age 54.  Pneumococcal 13-valent conjugate (PCV13) vaccine. One dose is recommended after age 2.  Pneumococcal polysaccharide (PPSV23) vaccine. One dose is recommended after age 59. Talk to your health care provider about which screenings and vaccines you need and how often you need them. This information is not intended to replace advice given to you by your health care provider. Make sure you discuss any questions you have with your health care provider. Document Released: 07/07/2015 Document Revised: 02/28/2016 Document Reviewed: 04/11/2015 Elsevier Interactive Patient Education  2017 [employer] Prevention in the Home Falls can cause injuries. They can happen to people of all ages. There are many things you can do to make your home safe and to help prevent falls. What can I do on the outside of my home?  Regularly fix the edges of walkways and driveways and fix any cracks.  Remove anything that might make you trip as you walk through a door, such as a raised step or threshold.  Trim any bushes or trees on the path to your home.  Use bright outdoor lighting.  Clear any walking paths of anything that might make someone trip, such as rocks or tools.  Regularly check to see if handrails are loose or broken. Make sure that both sides of any  steps have handrails.  Any raised decks and porches should have guardrails on the edges.  Have any leaves, snow, or ice cleared regularly.  Use sand or salt on walking paths during winter.  Clean up any spills in your garage right away. This includes oil or grease spills. What can I do in the bathroom?  Use night lights.  Install grab bars by the toilet and in the tub and shower. Do not use towel bars as grab bars.  Use non-skid mats or decals in the tub or shower.  If you need to sit down in the shower, use a plastic, non-slip stool.  Keep the floor dry. Clean up any water that spills on the floor as soon as it happens.  Remove soap buildup in the tub or shower regularly.  Attach bath mats securely with double-sided non-slip rug tape.  Do not have throw rugs and other things on the floor that can make you trip. What can I do in the bedroom?  Use night lights.  Make sure that you have a light by your bed that is easy to reach.  Do not use any sheets or blankets that are too big for your  bed. They should not hang down onto the floor.  Have a firm chair that has side arms. You can use this for support while you get dressed.  Do not have throw rugs and other things on the floor that can make you trip. What can I do in the kitchen?  Clean up any spills right away.  Avoid walking on wet floors.  Keep items that you use a lot in easy-to-reach places.  If you need to reach something above you, use a strong step stool that has a grab bar.  Keep electrical cords out of the way.  Do not use floor polish or wax that makes floors slippery. If you must use wax, use non-skid floor wax.  Do not have throw rugs and other things on the floor that can make you trip. What can I do with my stairs?  Do not leave any items on the stairs.  Make sure that there are handrails on both sides of the stairs and use them. Fix handrails that are broken or loose. Make sure that handrails are  as long as the stairways.  Check any carpeting to make sure that it is firmly attached to the stairs. Fix any carpet that is loose or worn.  Avoid having throw rugs at the top or bottom of the stairs. If you do have throw rugs, attach them to the floor with carpet tape.  Make sure that you have a light switch at the top of the stairs and the bottom of the stairs. If you do not have them, ask someone to add them for you. What else can I do to help prevent falls?  Wear shoes that:  Do not have high heels.  Have rubber bottoms.  Are comfortable and fit you well.  Are closed at the toe. Do not wear sandals.  If you use a stepladder:  Make sure that it is fully opened. Do not climb a closed stepladder.  Make sure that both sides of the stepladder are locked into place.  Ask someone to hold it for you, if possible.  Clearly mark and make sure that you can see:  Any grab bars or handrails.  First and last steps.  Where the edge of each step is.  Use tools that help you move around (mobility aids) if they are needed. These include:  Canes.  Walkers.  Scooters.  Crutches.  Turn on the lights when you go into a dark area. Replace any light bulbs as soon as they burn out.  Set up your furniture so you have a clear path. Avoid moving your furniture around.  If any of your floors are uneven, fix them.  If there are any pets around you, be aware of where they are.  Review your medicines with your doctor. Some medicines can make you feel dizzy. This can increase your chance of falling. Ask your doctor what other things that you can do to help prevent falls. This information is not intended to replace advice given to you by your health care provider. Make sure you discuss any questions you have with your health care provider. Document Released: 04/06/2009 Document Revised: 11/16/2015 Document Reviewed: 07/15/2014 Elsevier Interactive Patient Education  2017 [employer].

## 2021-01-15 ENCOUNTER — Other Ambulatory Visit: Payer: Self-pay | Admitting: Internal Medicine

## 2021-01-15 ENCOUNTER — Telehealth: Payer: Self-pay | Admitting: *Deleted

## 2021-01-15 DIAGNOSIS — E7800 Pure hypercholesterolemia, unspecified: Secondary | ICD-10-CM

## 2021-01-15 DIAGNOSIS — D649 Anemia, unspecified: Secondary | ICD-10-CM

## 2021-01-15 NOTE — Progress Notes (Signed)
Order placed for f/u labs.  

## 2021-01-15 NOTE — Telephone Encounter (Signed)
Orders placed for f/u labs.  

## 2021-01-15 NOTE — Telephone Encounter (Signed)
Please place future orders for lab appt.  

## 2021-01-16 ENCOUNTER — Other Ambulatory Visit (INDEPENDENT_AMBULATORY_CARE_PROVIDER_SITE_OTHER): Payer: Medicare PPO

## 2021-01-16 ENCOUNTER — Other Ambulatory Visit: Payer: Self-pay

## 2021-01-16 DIAGNOSIS — E7800 Pure hypercholesterolemia, unspecified: Secondary | ICD-10-CM | POA: Diagnosis not present

## 2021-01-16 LAB — LIPID PANEL
Cholesterol: 195 mg/dL (ref 0–200)
HDL: 60.6 mg/dL (ref >39.00)
LDL Cholesterol: 112 mg/dL — ABNORMAL HIGH (ref 0–99)
NonHDL: 134.78
Total CHOL/HDL Ratio: 3
Triglycerides: 114 mg/dL (ref 0.0–149.0)
VLDL: 22.8 mg/dL (ref 0.0–40.0)

## 2021-01-16 LAB — BASIC METABOLIC PANEL
BUN: 16 mg/dL (ref 6–23)
CO2: 29 mEq/L (ref 19–32)
Calcium: 9.4 mg/dL (ref 8.4–10.5)
Chloride: 104 mEq/L (ref 96–112)
Creatinine, Ser: 0.9 mg/dL (ref 0.40–1.20)
GFR: 60.89 mL/min (ref >60.00)
Glucose, Bld: 89 mg/dL (ref 70–99)
Potassium: 4.5 mEq/L (ref 3.5–5.1)
Sodium: 139 mEq/L (ref 135–145)

## 2021-01-16 LAB — HEPATIC FUNCTION PANEL
ALT: 12 U/L (ref 0–35)
AST: 19 U/L (ref 0–37)
Albumin: 3.6 g/dL (ref 3.5–5.2)
Alkaline Phosphatase: 52 U/L (ref 39–117)
Bilirubin, Direct: 0.1 mg/dL (ref 0.0–0.3)
Total Bilirubin: 0.7 mg/dL (ref 0.2–1.2)
Total Protein: 7.4 g/dL (ref 6.0–8.3)

## 2021-01-16 LAB — TSH: TSH: 4.82 u[IU]/mL (ref 0.35–5.50)

## 2021-01-18 ENCOUNTER — Ambulatory Visit (INDEPENDENT_AMBULATORY_CARE_PROVIDER_SITE_OTHER): Payer: Medicare PPO | Admitting: Internal Medicine

## 2021-01-18 ENCOUNTER — Other Ambulatory Visit: Payer: Self-pay

## 2021-01-18 VITALS — BP 120/70 | HR 79 | Temp 97.4°F | Resp 16 | Ht 62.0 in | Wt 133.0 lb

## 2021-01-18 DIAGNOSIS — E7800 Pure hypercholesterolemia, unspecified: Secondary | ICD-10-CM | POA: Diagnosis not present

## 2021-01-18 DIAGNOSIS — D649 Anemia, unspecified: Secondary | ICD-10-CM | POA: Diagnosis not present

## 2021-01-18 DIAGNOSIS — Z0000 Encounter for general adult medical examination without abnormal findings: Secondary | ICD-10-CM

## 2021-01-18 DIAGNOSIS — Z853 Personal history of malignant neoplasm of breast: Secondary | ICD-10-CM

## 2021-01-18 NOTE — Assessment & Plan Note (Addendum)
Physical today 01/18/21.  Mammogram 06/14/20 - Birads II.  Colonoscopy 08/2013.  Recommended f/u colonoscopy in 10 years.  Declines hemoccult cards.

## 2021-01-18 NOTE — Progress Notes (Signed)
Patient ID: Renee Vazquez, female   DOB: 1940/07/17, 80 y.o.   MRN: [id number]   Subjective:    Patient ID: Renee Vazquez, female    DOB: 1940/12/16, 80 y.o.   MRN: [id number]  HPI This visit occurred during the SARS-CoV-2 public health emergency.  Safety protocols were in place, including screening questions prior to the visit, additional usage of staff PPE, and extensive cleaning of exam room while observing appropriate contact time as indicated for disinfecting solutions.   Patient here for her physical exam.  She is doing well.  Feels good.  Staying active.  Back line dancing.  No chest pain or sob with increased activity or exertion.  No cough or congestion.  No acid reflux.  No abdominal pain.  Bowels moving.  She has adjusted her diet.  No soft drinks.  Watching what she eats.  Has lost weight.  Feels good.    Past Medical History:  Diagnosis Date   Breast cancer ([place])    Dr Ewell Poe   Hypercholesterolemia    Osteopenia    Past Surgical History:  Procedure Laterality Date   BREAST LUMPECTOMY  2007   EYE SURGERY     detached retina   Family History  Problem Relation Age of Onset   Hypertension Mother    Hypercholesterolemia Mother    Prostate cancer Father    Breast cancer Maternal Aunt    Melanoma Sister    Vesicoureteral reflux Daughter    Colon cancer Neg Hx    Social History   Socioeconomic History   Marital status: Divorced    Spouse name: Not on file   Number of children: 4   Years of education: Not on file   Highest education level: Not on file  Occupational History   Occupation: retired  Tobacco Use   Smoking status: Never   Smokeless tobacco: Never  Vaping Use   Vaping Use: Never used  Substance and Sexual Activity   Alcohol use: Yes    Alcohol/week: 0.0 standard drinks    Comment: occasional   Drug use: No   Sexual activity: Not Currently    Birth control/protection: Post-menopausal  Other Topics Concern   Not on file   Social History Narrative   Not on file   Social Determinants of Health   Financial Resource Strain: Low Risk    Difficulty of Paying Living Expenses: Not hard at all  Food Insecurity: No Food Insecurity   Worried About Charity fundraiser in the Last Year: Never true   [place] in the Last Year: Never true  Transportation Needs: No Transportation Needs   Lack of Transportation (Medical): No   Lack of Transportation (Non-Medical): No  Physical Activity: Sufficiently Active   Days of Exercise per Week: 7 days   Minutes of Exercise per Session: 150+ min  Stress: No Stress Concern Present   Feeling of Stress : Not at all  Social Connections: Unknown   Frequency of Communication with Friends and Family: More than three times a week   Frequency of Social Gatherings with Friends and Family: More than three times a week   Attends Religious Services: Not on Electrical engineer or Organizations: Not on file   Attends Archivist Meetings: Not on file   Marital Status: Not on file     Review of Systems  Constitutional:  Negative for appetite change.       Has adjusted diet.  Lost weight.   HENT:  Negative for congestion, sinus pressure and sore throat.   Eyes:  Negative for pain and visual disturbance.  Respiratory:  Negative for cough, chest tightness and shortness of breath.   Cardiovascular:  Negative for chest pain, palpitations and leg swelling.  Gastrointestinal:  Negative for abdominal pain, diarrhea, nausea and vomiting.  Genitourinary:  Negative for difficulty urinating and dysuria.  Musculoskeletal:  Negative for back pain, joint swelling and myalgias.  Skin:  Negative for color change and rash.  Neurological:  Negative for dizziness, light-headedness and headaches.  Hematological:  Negative for adenopathy. Does not bruise/bleed easily.  Psychiatric/Behavioral:  Negative for agitation and dysphoric mood.       Objective:    Physical Exam Vitals  reviewed.  Constitutional:      General: She is not in acute distress.    Appearance: Normal appearance.  HENT:     Head: Normocephalic and atraumatic.     Right Ear: External ear normal.     Left Ear: External ear normal.  Eyes:     General: No scleral icterus.       Right eye: No discharge.        Left eye: No discharge.     Conjunctiva/sclera: Conjunctivae normal.  Neck:     Thyroid: No thyromegaly.  Cardiovascular:     Rate and Rhythm: Normal rate and regular rhythm.  Pulmonary:     Effort: No respiratory distress.     Breath sounds: Normal breath sounds. No wheezing.     Comments: Breasts:  nipple inversion - left.  No nipple discharge.  No distinct nodules or axillary adenopathy appreciated.  Abdominal:     General: Bowel sounds are normal.     Palpations: Abdomen is soft.     Tenderness: There is no abdominal tenderness.  Musculoskeletal:        General: No swelling or tenderness.     Cervical back: Neck supple. No tenderness.  Lymphadenopathy:     Cervical: No cervical adenopathy.  Skin:    Findings: No erythema or rash.  Neurological:     Mental Status: She is alert.  Psychiatric:        Mood and Affect: Mood normal.        Behavior: Behavior normal.    BP 120/70   Pulse 79   Temp (!) 97.4 F (36.3 C)   Resp 16   Ht '5\' 2"'$  (1.575 m)   Wt 133 lb (60.3 kg)   SpO2 97%   BMI 24.33 kg/m  Wt Readings from Last 3 Encounters:  01/18/21 133 lb (60.3 kg)  11/03/20 138 lb (62.6 kg)  09/04/20 138 lb (62.6 kg)    Outpatient Encounter Medications as of 01/18/2021  Medication Sig   calcium citrate-vitamin D 200-200 MG-UNIT TABS Take 1 tablet by mouth daily.   cetirizine (ZYRTEC) 10 MG tablet Take 10 mg by mouth daily.   MELATONIN PO Take by mouth at bedtime as needed (sleep).   Multiple Vitamins-Minerals (CENTRUM SILVER ADULT 50+ PO) Take by mouth.   Multiple Vitamins-Minerals (PRESERVISION AREDS 2) CAPS Take 2 capsules by mouth daily.   No facility-administered  encounter medications on file as of 01/18/2021.     Lab Results  Component Value Date   WBC 5.7 07/17/2020   HGB 13.9 07/17/2020   HCT 41.2 07/17/2020   PLT 247.0 07/17/2020   GLUCOSE 89 01/16/2021   CHOL 195 01/16/2021   TRIG 114.0 01/16/2021   HDL 60.60 01/16/2021  LDLDIRECT 152.4 08/16/2013   LDLCALC 112 (H) 01/16/2021   ALT 12 01/16/2021   AST 19 01/16/2021   NA 139 01/16/2021   K 4.5 01/16/2021   CL 104 01/16/2021   CREATININE 0.90 01/16/2021   BUN 16 01/16/2021   CO2 29 01/16/2021   TSH 4.82 01/16/2021    No results found.     Assessment & Plan:   Problem List Items Addressed This Visit     Anemia    Colonoscopy as outlined.  Recheck cbc.        Relevant Orders   Basic metabolic panel   CBC with Differential/Platelet   TSH   Health care maintenance    Physical today 01/18/21.  Mammogram 06/14/20 - Birads II.  Colonoscopy 08/2013.  Recommended f/u colonoscopy in 10 years.  Declines hemoccult cards.        History of breast cancer    Mammogram 05/2020 - ok.  Follow.        Hypercholesterolemia    The 10-year ASCVD risk score Mikey Bussing DC Jr., et al., 2013) is: 21.9%   Values used to calculate the score:     Age: 60 years     Sex: Female     Is Non-Hispanic African American: No     Diabetic: No     Tobacco smoker: No     Systolic Blood Pressure: 123456 mmHg     Is BP treated: No     HDL Cholesterol: 60.6 mg/dL     Total Cholesterol: 195 mg/dL  Discussed low cholesterol diet and exercise. Discussed recommendation to start a statin.  She declines.  Follow lipid panel.        Relevant Orders   Hepatic function panel   Lipid panel   Other Visit Diagnoses     Routine general medical examination at a health care facility    -  Primary        Einar Pheasant, MD

## 2021-01-23 ENCOUNTER — Encounter: Payer: Self-pay | Admitting: Internal Medicine

## 2021-01-23 NOTE — Assessment & Plan Note (Signed)
Mammogram 05/2020 - ok.  Follow.

## 2021-01-23 NOTE — Assessment & Plan Note (Signed)
The 10-year ASCVD risk score Mikey Bussing DC Brooke Bonito., et al., 2013) is: 21.9%   Values used to calculate the score:     Age: 80 years     Sex: Female     Is Non-Hispanic African American: No     Diabetic: No     Tobacco smoker: No     Systolic Blood Pressure: 123456 mmHg     Is BP treated: No     HDL Cholesterol: 60.6 mg/dL     Total Cholesterol: 195 mg/dL  Discussed low cholesterol diet and exercise. Discussed recommendation to start a statin.  She declines.  Follow lipid panel.

## 2021-01-23 NOTE — Assessment & Plan Note (Signed)
Colonoscopy as outlined.  Recheck cbc.

## 2021-04-11 DIAGNOSIS — H353131 Nonexudative age-related macular degeneration, bilateral, early dry stage: Secondary | ICD-10-CM | POA: Diagnosis not present

## 2021-04-11 DIAGNOSIS — Z0100 Encounter for examination of eyes and vision without abnormal findings: Secondary | ICD-10-CM | POA: Diagnosis not present

## 2021-04-23 ENCOUNTER — Telehealth: Payer: Self-pay | Admitting: Internal Medicine

## 2021-04-23 ENCOUNTER — Encounter: Payer: Self-pay | Admitting: Internal Medicine

## 2021-04-23 NOTE — Telephone Encounter (Signed)
Renee Vazquez K  P [facility] (supporting Einar Pheasant, MD) 51 minutes ago (7:37 AM)   Dr Nicki Reaper I was at the beach last week came back yesterday  All the way home coughing and runny nose  I have temp of 99.5 took covid test negitive   not hungry I have the chills feel like Im going to throw up  took robittussin   when i cough chest is tight   what else can i do or take thank you

## 2021-04-23 NOTE — Telephone Encounter (Signed)
FYI I spoke with patient to triage. Pt was very hoarse, coughing & I could hear congestion in her chest. She stated that she had not checked her temp the last few hours, but did have chills. She denies any SOB. Since she was Covid negative I advised that she most likely needed to be tested for flu. Pt stated that she would go to our [facility]. I suggested one of ours or [facility]. She wanted to go to [facility] & wasnted patient seen today. She stated that she would get ready & head over there.

## 2021-04-23 NOTE — Telephone Encounter (Signed)
Added to telephone encounter and routed to the CMA/LPN for triage

## 2021-04-24 ENCOUNTER — Telehealth: Payer: Self-pay

## 2021-04-24 NOTE — Telephone Encounter (Signed)
Ok to work in for virtual visit.

## 2021-04-24 NOTE — Telephone Encounter (Signed)
went to walk in at [facility]   they told me it would be a 3 hour wait  I could not sit in that room for 3 hours  I came home tommorow when dr scott is in maybe I can have the flu test and she can prescribe something    Thressa Sheller, CMA Yesterday (8:30 AM)   Added to telephone encounter and routed to the CMA/LPN for triage       Note    Laneka, Mcgr[place] [facility] (supporting Einar Pheasant, MD) Yesterday (7:37 AM)  Dr Nicki Reaper I was at the beach last week came back yesterday  All the way home coughing and runny nose  I have temp of 99.5 took covid test negitive   not hungry I have the chills feel like Im going to throw up  took robittussin   when i cough chest is tight   what else can i do or take thank you

## 2021-04-24 NOTE — Telephone Encounter (Signed)
Called patient. Unable to leave message, phone kept ringing.

## 2021-04-24 NOTE — Telephone Encounter (Signed)
Offered patient virtual appt. Patient does not want to do virtual. Advised that we can still do the swabs, etc with a virtual visit. Patient agreed but stated she did not know how. Advised that I would send a link to her cell phone. Declined using cell phone. Her computer does not have camera. Patient stated that this was too difficult already. She is going to go to urgent care.

## 2021-04-24 NOTE — Telephone Encounter (Signed)
See more recent phone note.

## 2021-04-24 NOTE — Telephone Encounter (Signed)
Please let her know that I was not in the office yesterday and confirm she was evaluated and is doing ok.

## 2021-04-24 NOTE — Telephone Encounter (Signed)
Called patient to confirm no sob, chest pain, fever, etc. She is having cough, nausea, runny nose, decreased appetite, and just over all not feeling well. Symptoms started Monday morning. She tried to go to [facility]- 3 hour wait and [facility] did not have any spots to see her yesterday. Patient would like to know if we could do a virtual this afternoon. If not- she is ok to go to urgent care but would like to be evaluated today in case her covid test was a false negative, she will not be out of the window for antiviral treatment.

## 2021-04-25 DIAGNOSIS — U071 COVID-19: Secondary | ICD-10-CM | POA: Diagnosis not present

## 2021-04-25 DIAGNOSIS — Z03818 Encounter for observation for suspected exposure to other biological agents ruled out: Secondary | ICD-10-CM | POA: Diagnosis not present

## 2021-04-25 DIAGNOSIS — Z20822 Contact with and (suspected) exposure to covid-19: Secondary | ICD-10-CM | POA: Diagnosis not present

## 2021-04-25 DIAGNOSIS — R509 Fever, unspecified: Secondary | ICD-10-CM | POA: Diagnosis not present

## 2021-05-28 DIAGNOSIS — J069 Acute upper respiratory infection, unspecified: Secondary | ICD-10-CM | POA: Diagnosis not present

## 2021-06-27 DIAGNOSIS — H353131 Nonexudative age-related macular degeneration, bilateral, early dry stage: Secondary | ICD-10-CM | POA: Diagnosis not present

## 2021-06-27 DIAGNOSIS — H524 Presbyopia: Secondary | ICD-10-CM | POA: Diagnosis not present

## 2021-06-27 DIAGNOSIS — H2513 Age-related nuclear cataract, bilateral: Secondary | ICD-10-CM | POA: Diagnosis not present

## 2021-07-02 DIAGNOSIS — Z1231 Encounter for screening mammogram for malignant neoplasm of breast: Secondary | ICD-10-CM | POA: Diagnosis not present

## 2021-07-02 LAB — HM MAMMOGRAPHY

## 2021-07-20 ENCOUNTER — Other Ambulatory Visit: Payer: Self-pay

## 2021-07-20 ENCOUNTER — Other Ambulatory Visit (INDEPENDENT_AMBULATORY_CARE_PROVIDER_SITE_OTHER): Payer: Medicare PPO

## 2021-07-20 DIAGNOSIS — D649 Anemia, unspecified: Secondary | ICD-10-CM

## 2021-07-20 DIAGNOSIS — E7800 Pure hypercholesterolemia, unspecified: Secondary | ICD-10-CM

## 2021-07-20 LAB — CBC WITH DIFFERENTIAL/PLATELET
Basophils Absolute: 0 10*3/uL (ref 0.0–0.1)
Basophils Relative: 0.7 % (ref 0.0–3.0)
Eosinophils Absolute: 0.1 10*3/uL (ref 0.0–0.7)
Eosinophils Relative: 2.7 % (ref 0.0–5.0)
HCT: 40.8 % (ref 36.0–46.0)
Hemoglobin: 13.5 g/dL (ref 12.0–15.0)
Lymphocytes Relative: 40.7 % (ref 12.0–46.0)
Lymphs Abs: 2.1 10*3/uL (ref 0.7–4.0)
MCHC: 33 g/dL (ref 30.0–36.0)
MCV: 92.9 fl (ref 78.0–100.0)
Monocytes Absolute: 0.5 10*3/uL (ref 0.1–1.0)
Monocytes Relative: 10.7 % (ref 3.0–12.0)
Neutro Abs: 2.3 10*3/uL (ref 1.4–7.7)
Neutrophils Relative %: 45.2 % (ref 43.0–77.0)
Platelets: 277 10*3/uL (ref 150.0–400.0)
RBC: 4.4 Mil/uL (ref 3.87–5.11)
RDW: 13.8 % (ref 11.5–15.5)
WBC: 5.1 10*3/uL (ref 4.0–10.5)

## 2021-07-20 LAB — LIPID PANEL
Cholesterol: 217 mg/dL — ABNORMAL HIGH (ref 0–200)
HDL: 64.3 mg/dL (ref >39.00)
LDL Cholesterol: 131 mg/dL — ABNORMAL HIGH (ref 0–99)
NonHDL: 152.37
Total CHOL/HDL Ratio: 3
Triglycerides: 108 mg/dL (ref 0.0–149.0)
VLDL: 21.6 mg/dL (ref 0.0–40.0)

## 2021-07-20 LAB — BASIC METABOLIC PANEL
BUN: 21 mg/dL (ref 6–23)
CO2: 28 mEq/L (ref 19–32)
Calcium: 9.5 mg/dL (ref 8.4–10.5)
Chloride: 102 mEq/L (ref 96–112)
Creatinine, Ser: 0.97 mg/dL (ref 0.40–1.20)
GFR: 55.46 mL/min — ABNORMAL LOW (ref >60.00)
Glucose, Bld: 92 mg/dL (ref 70–99)
Potassium: 4.4 mEq/L (ref 3.5–5.1)
Sodium: 139 mEq/L (ref 135–145)

## 2021-07-20 LAB — HEPATIC FUNCTION PANEL
ALT: 12 U/L (ref 0–35)
AST: 17 U/L (ref 0–37)
Albumin: 3.7 g/dL (ref 3.5–5.2)
Alkaline Phosphatase: 55 U/L (ref 39–117)
Bilirubin, Direct: 0.1 mg/dL (ref 0.0–0.3)
Total Bilirubin: 0.7 mg/dL (ref 0.2–1.2)
Total Protein: 7.8 g/dL (ref 6.0–8.3)

## 2021-07-20 LAB — TSH: TSH: 5.07 u[IU]/mL (ref 0.35–5.50)

## 2021-07-23 ENCOUNTER — Ambulatory Visit (INDEPENDENT_AMBULATORY_CARE_PROVIDER_SITE_OTHER): Payer: Medicare PPO

## 2021-07-23 ENCOUNTER — Other Ambulatory Visit: Payer: Self-pay

## 2021-07-23 ENCOUNTER — Ambulatory Visit: Payer: Medicare PPO | Admitting: Internal Medicine

## 2021-07-23 VITALS — BP 118/70 | HR 89 | Temp 98.0°F | Resp 16 | Ht 62.0 in | Wt 129.8 lb

## 2021-07-23 DIAGNOSIS — R87610 Atypical squamous cells of undetermined significance on cytologic smear of cervix (ASC-US): Secondary | ICD-10-CM

## 2021-07-23 DIAGNOSIS — Z8616 Personal history of COVID-19: Secondary | ICD-10-CM | POA: Diagnosis not present

## 2021-07-23 DIAGNOSIS — M8580 Other specified disorders of bone density and structure, unspecified site: Secondary | ICD-10-CM

## 2021-07-23 DIAGNOSIS — R0989 Other specified symptoms and signs involving the circulatory and respiratory systems: Secondary | ICD-10-CM | POA: Diagnosis not present

## 2021-07-23 DIAGNOSIS — E2839 Other primary ovarian failure: Secondary | ICD-10-CM | POA: Diagnosis not present

## 2021-07-23 DIAGNOSIS — D649 Anemia, unspecified: Secondary | ICD-10-CM

## 2021-07-23 DIAGNOSIS — K449 Diaphragmatic hernia without obstruction or gangrene: Secondary | ICD-10-CM | POA: Diagnosis not present

## 2021-07-23 DIAGNOSIS — R9389 Abnormal findings on diagnostic imaging of other specified body structures: Secondary | ICD-10-CM | POA: Diagnosis not present

## 2021-07-23 DIAGNOSIS — Z853 Personal history of malignant neoplasm of breast: Secondary | ICD-10-CM | POA: Diagnosis not present

## 2021-07-23 DIAGNOSIS — J439 Emphysema, unspecified: Secondary | ICD-10-CM | POA: Diagnosis not present

## 2021-07-23 DIAGNOSIS — E7800 Pure hypercholesterolemia, unspecified: Secondary | ICD-10-CM

## 2021-07-23 IMAGING — DX DG CHEST 2V
2 series · 2 of 2 positions shown · non-contrast
Comparison: None.

CLINICAL DATA: 80-year-old female with a history of congestion and
COVID

EXAM:
CHEST - 2 VIEW

[chest pa]
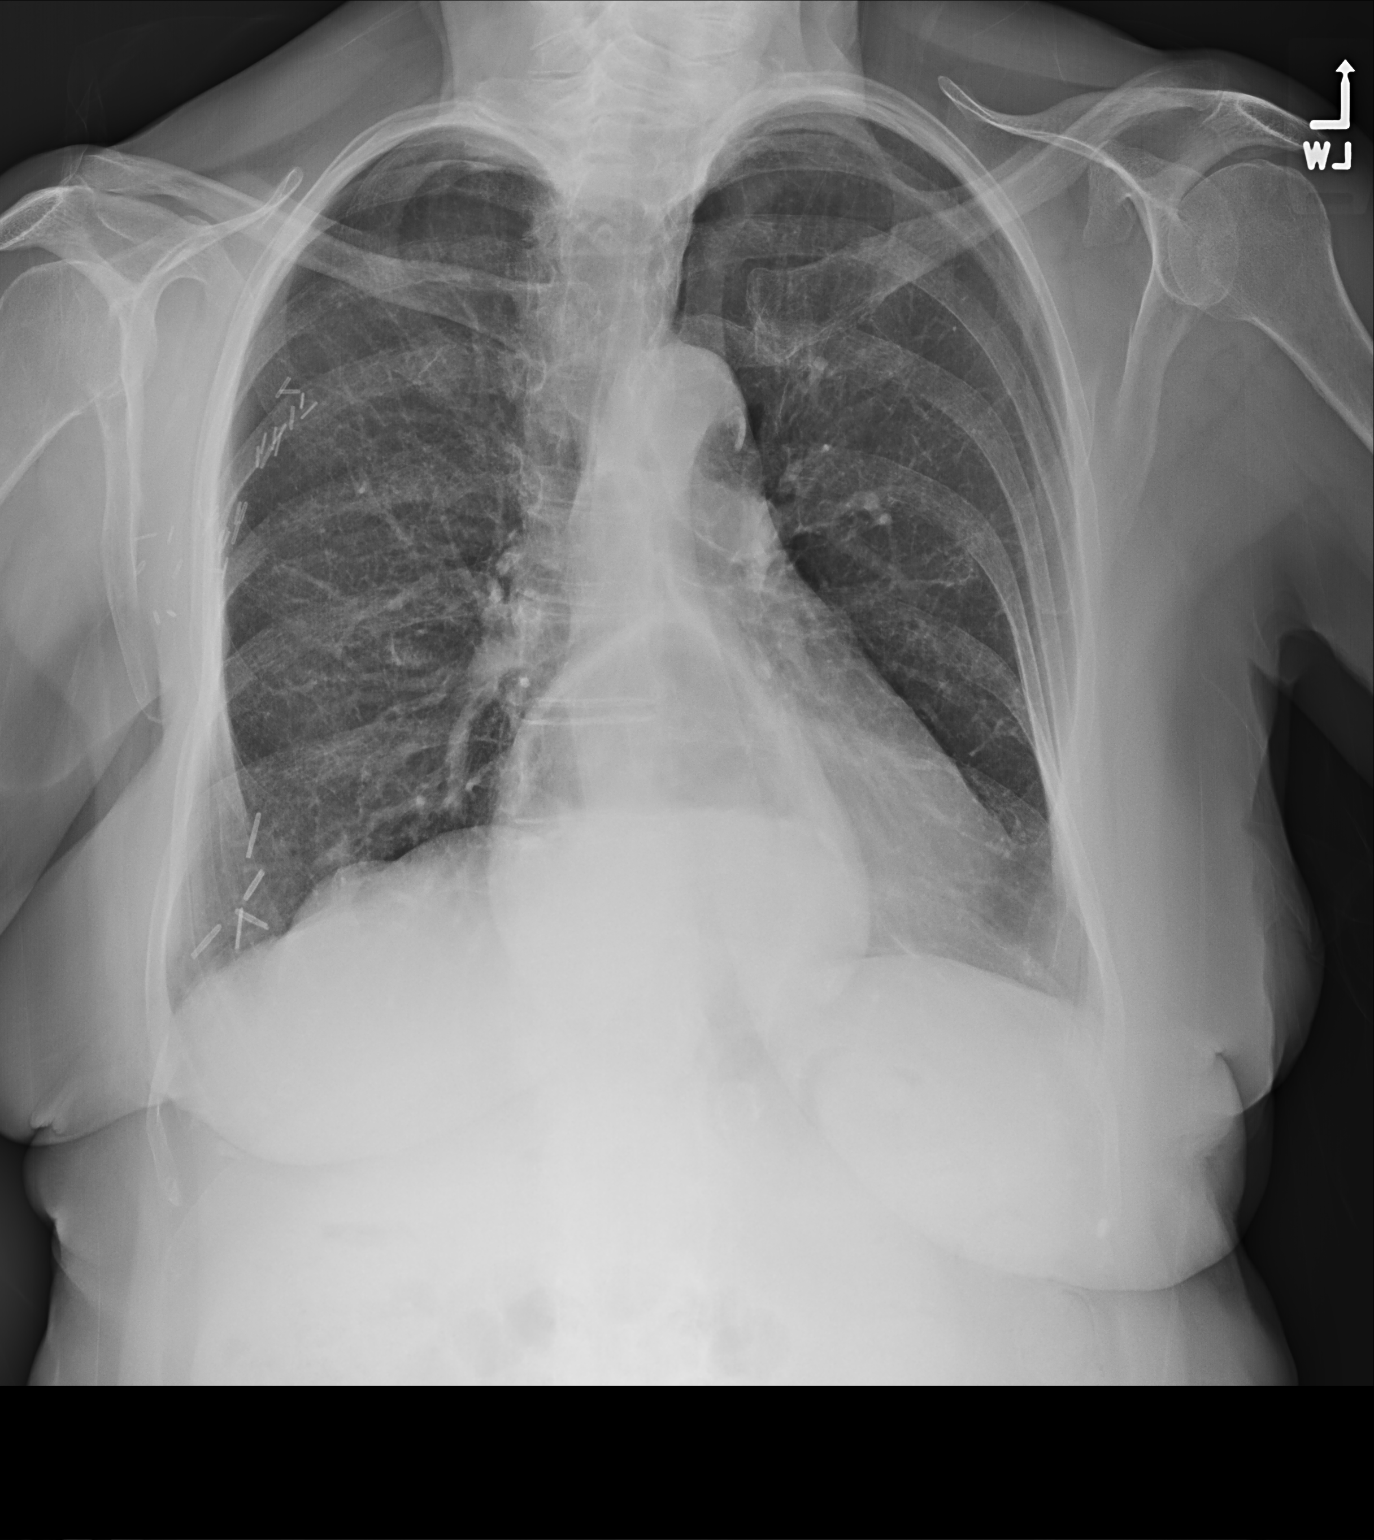

[chest lat]
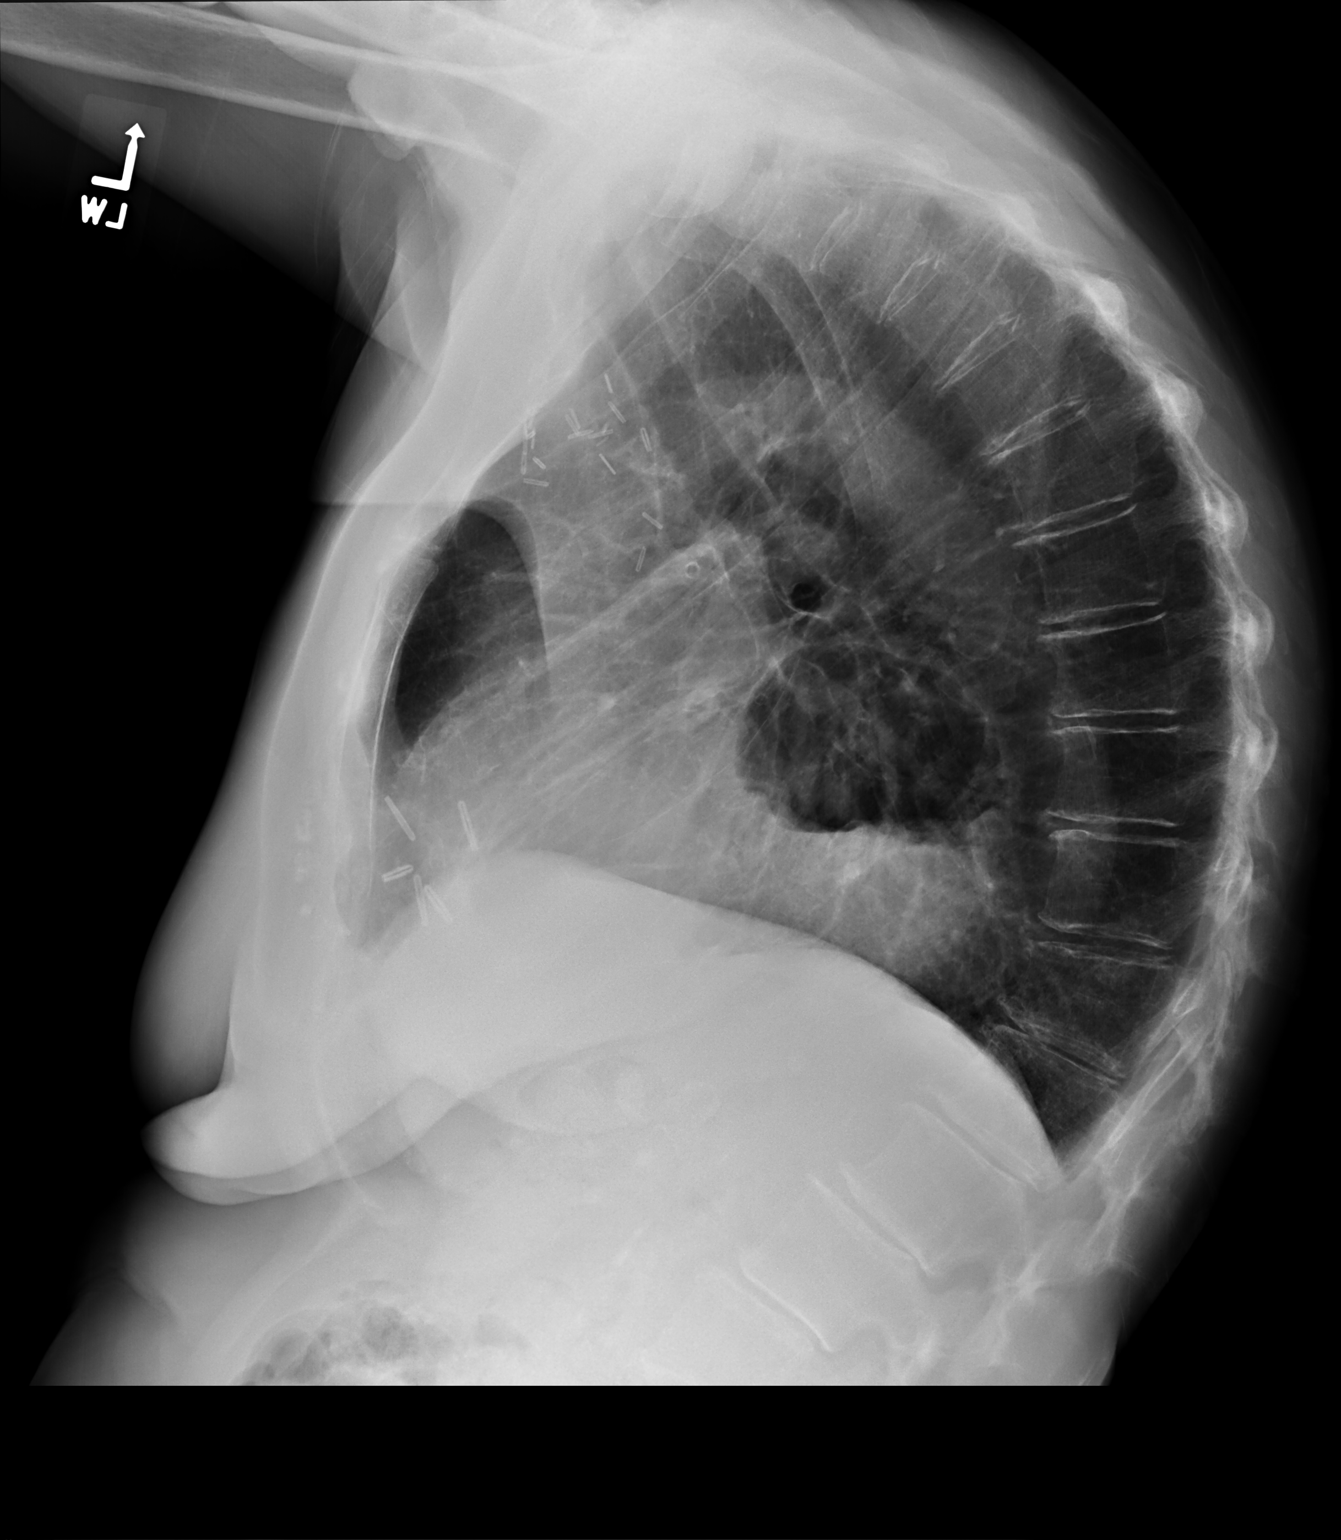

[2 of 2 positions shown; findings below may reference images not displayed]

FINDINGS: Cardiomediastinal silhouette borderline enlarged in size and
contour. No evidence of central vascular congestion. No interlobular
septal thickening.

Double density projecting over the lower mediastinum with air-fluid
level. No comparison.

Stigmata of emphysema, with increased retrosternal airspace,
flattened hemidiaphragms, increased AP diameter, and hyperinflation
on the AP view.

No pneumothorax or pleural effusion. Coarsened interstitial
markings, with no confluent airspace disease.

No acute displaced fracture. Degenerative changes of the spine.

Surgical changes of the right chest.
IMPRESSION: Negative for acute cardiopulmonary disease.

Hiatal hernia

## 2021-07-23 NOTE — Progress Notes (Signed)
Patient ID: Renee Vazquez, female   DOB: [birth year], 81 y.o.   MRN: [id number]   Subjective:    Patient ID: Renee Vazquez, female    DOB: 1940-09-07, 81 y.o.   MRN: [id number]  This visit occurred during the SARS-CoV-2 public health emergency.  Safety protocols were in place, including screening questions prior to the visit, additional usage of staff PPE, and extensive cleaning of exam room while observing appropriate contact time as indicated for disinfecting solutions.   Patient here for a scheduled follow up .   HPI Here to follow up regarding her cholesterol.  Was evaluated 04/25/21 - diagnosed with covid.  Treated with paxlovid.  Reevaluated 05/28/21 - diagnosed with URI.  Treated.  Reports she is feeling better now.  No fever now.  No increased sinus congestion or headache.  Still with clearing of her throat/cough.  Denies any deep chest congestion or increased cough.  No chest pain or sob.  No abdominal pain.  Bowels moving.  Discussed labs.  Weight is down.  She reports is now improving.  Was 124 pounds.    Past Medical History:  Diagnosis Date   Breast cancer ([place])    Dr Ewell Poe   Hypercholesterolemia    Osteopenia    Past Surgical History:  Procedure Laterality Date   BREAST LUMPECTOMY  2007   EYE SURGERY     detached retina   Family History  Problem Relation Age of Onset   Hypertension Mother    Hypercholesterolemia Mother    Prostate cancer Father    Breast cancer Maternal Aunt    Melanoma Sister    Vesicoureteral reflux Daughter    Colon cancer Neg Hx    Social History   Socioeconomic History   Marital status: Divorced    Spouse name: Not on file   Number of children: 4   Years of education: Not on file   Highest education level: Not on file  Occupational History   Occupation: retired  Tobacco Use   Smoking status: Never   Smokeless tobacco: Never  Vaping Use   Vaping Use: Never used  Substance and Sexual Activity   Alcohol use:  Yes    Alcohol/week: 0.0 standard drinks    Comment: occasional   Drug use: No   Sexual activity: Not Currently    Birth control/protection: Post-menopausal  Other Topics Concern   Not on file  Social History Narrative   Not on file   Social Determinants of Health   Financial Resource Strain: Low Risk    Difficulty of Paying Living Expenses: Not hard at all  Food Insecurity: No Food Insecurity   Worried About Charity fundraiser in the Last Year: Never true   [place] in the Last Year: Never true  Transportation Needs: No Transportation Needs   Lack of Transportation (Medical): No   Lack of Transportation (Non-Medical): No  Physical Activity: Sufficiently Active   Days of Exercise per Week: 7 days   Minutes of Exercise per Session: 150+ min  Stress: No Stress Concern Present   Feeling of Stress : Not at all  Social Connections: Unknown   Frequency of Communication with Friends and Family: More than three times a week   Frequency of Social Gatherings with Friends and Family: More than three times a week   Attends Religious Services: Not on file   Active Member of Clubs or Organizations: Not on file   Attends Archivist Meetings: Not  on file   Marital Status: Not on file     Review of Systems  Constitutional:  Negative for fever and unexpected weight change.  HENT:  Negative for sinus pressure.        No increased sinus pressure.  Does report clearing of throat.   Respiratory:  Negative for chest tightness and shortness of breath.        Cough is better.   Cardiovascular:  Negative for chest pain, palpitations and leg swelling.  Gastrointestinal:  Negative for abdominal pain, diarrhea, nausea and vomiting.  Genitourinary:  Negative for difficulty urinating and dysuria.  Musculoskeletal:  Negative for joint swelling and myalgias.  Skin:  Negative for color change and rash.  Neurological:  Negative for dizziness, light-headedness and headaches.   Psychiatric/Behavioral:  Negative for agitation and dysphoric mood.       Objective:     BP 118/70    Pulse 89    Temp 98 F (36.7 C)    Resp 16    Ht 5\' 2"  (1.575 m)    Wt 129 lb 12.8 oz (58.9 kg)    SpO2 98%    BMI 23.74 kg/m  Wt Readings from Last 3 Encounters:  07/23/21 129 lb 12.8 oz (58.9 kg)  01/18/21 133 lb (60.3 kg)  11/03/20 138 lb (62.6 kg)    Physical Exam Vitals reviewed.  Constitutional:      General: She is not in acute distress.    Appearance: Normal appearance.  HENT:     Head: Normocephalic and atraumatic.     Right Ear: External ear normal.     Left Ear: External ear normal.  Eyes:     General: No scleral icterus.       Right eye: No discharge.        Left eye: No discharge.     Conjunctiva/sclera: Conjunctivae normal.  Neck:     Thyroid: No thyromegaly.  Cardiovascular:     Rate and Rhythm: Normal rate and regular rhythm.  Pulmonary:     Effort: No respiratory distress.     Breath sounds: Normal breath sounds. No wheezing.  Abdominal:     General: Bowel sounds are normal.     Palpations: Abdomen is soft.     Tenderness: There is no abdominal tenderness.  Musculoskeletal:        General: No swelling or tenderness.     Cervical back: Neck supple. No tenderness.  Lymphadenopathy:     Cervical: No cervical adenopathy.  Skin:    Findings: No erythema or rash.  Neurological:     Mental Status: She is alert.  Psychiatric:        Mood and Affect: Mood normal.        Behavior: Behavior normal.     Outpatient Encounter Medications as of 07/23/2021  Medication Sig   calcium citrate-vitamin D 200-200 MG-UNIT TABS Take 1 tablet by mouth daily.   cetirizine (ZYRTEC) 10 MG tablet Take 10 mg by mouth daily.   MELATONIN PO Take by mouth at bedtime as needed (sleep).   Multiple Vitamins-Minerals (CENTRUM SILVER ADULT 50+ PO) Take by mouth.   Multiple Vitamins-Minerals (PRESERVISION AREDS 2) CAPS Take 2 capsules by mouth daily.   No  facility-administered encounter medications on file as of 07/23/2021.     Lab Results  Component Value Date   WBC 5.1 07/20/2021   HGB 13.5 07/20/2021   HCT 40.8 07/20/2021   PLT 277.0 07/20/2021   GLUCOSE 92 07/20/2021  CHOL 217 (H) 07/20/2021   TRIG 108.0 07/20/2021   HDL 64.30 07/20/2021   LDLDIRECT 152.4 08/16/2013   LDLCALC 131 (H) 07/20/2021   ALT 12 07/20/2021   AST 17 07/20/2021   NA 139 07/20/2021   K 4.4 07/20/2021   CL 102 07/20/2021   CREATININE 0.97 07/20/2021   BUN 21 07/20/2021   CO2 28 07/20/2021   TSH 5.07 07/20/2021       Assessment & Plan:   Problem List Items Addressed This Visit     Abnormal Pap smear of cervix    Saw gyn 12/2018.  Unable to do pap secondary to atrophy.  Discussed further f/u or reevaluation.  Declines.       Anemia    Follow cbc.        History of breast cancer    Mammogram 07/02/21 - Birads II.       History of COVID-19    Recent covid.  Persistent symptoms as outlined.  Improved.  Check cxr.       Relevant Orders   DG Chest 2 View (Completed)   Hypercholesterolemia    The 10-year ASCVD risk score (Arnett DK, et al., 2019) is: 21.4%   Values used to calculate the score:     Age: 44 years     Sex: Female     Is Non-Hispanic African American: No     Diabetic: No     Tobacco smoker: No     Systolic Blood Pressure: 646 mmHg     Is BP treated: No     HDL Cholesterol: 64.3 mg/dL     Total Cholesterol: 217 mg/dL  Discussed calculated cholesterol risk.  Discussed recommendation for cholesterol medication.  She declines.  Follow lipid panel.       Relevant Orders   Basic metabolic panel   Lipid panel   Hepatic function panel   TSH   Osteopenia    Discussed f/u bone density.  Agreeable to schedule.       Thickened endometrium    Previously had ultrasound that revealed thickened endometrium.  S/p endometrial biopsy - insufficient tissue for diagnosis.  S/p hysteroscopy and D&C.  Reports no bleeding.  Desires no  further w/up or evaluation.       Throat clearing    Throat clearing as outlined.  Trial of pepcid.        Other Visit Diagnoses     Estrogen deficiency    -  Primary   Relevant Orders   DG Bone Density        Einar Pheasant, MD

## 2021-07-23 NOTE — Patient Instructions (Signed)
Pepcid 20mg  - take one tablet 30 minutes before breakfast

## 2021-07-23 NOTE — Assessment & Plan Note (Addendum)
The 10-year ASCVD risk score (Arnett DK, et al., 2019) is: 21.4%   Values used to calculate the score:     Age: 81 years     Sex: Female     Is Non-Hispanic African American: No     Diabetic: No     Tobacco smoker: No     Systolic Blood Pressure: 482 mmHg     Is BP treated: No     HDL Cholesterol: 64.3 mg/dL     Total Cholesterol: 217 mg/dL  Discussed calculated cholesterol risk.  Discussed recommendation for cholesterol medication.  She declines.  Follow lipid panel.

## 2021-07-24 ENCOUNTER — Telehealth: Payer: Self-pay

## 2021-07-24 NOTE — Telephone Encounter (Signed)
Patient returning a call to Baxter Hire is unavailable. Please call patient @ [phone].

## 2021-07-24 NOTE — Telephone Encounter (Signed)
LMTCB

## 2021-07-24 NOTE — Telephone Encounter (Signed)
-----   Message from Einar Pheasant, MD sent at 07/24/2021  5:24 AM EST ----- Notify Ms Rubey that her cxr revealed no acute abnormality.  Some changes that could be consistent with possible scarring.  No fluid or mass, etc.  Given changes and recent covid, I would like to schedule CT scan to further evaluate.  Does appear to have hiatal hernia.

## 2021-07-24 NOTE — Telephone Encounter (Signed)
Pt returning call

## 2021-07-25 NOTE — Telephone Encounter (Signed)
Patient aware but would like to wait on CT until august visit and if needed discuss then.

## 2021-08-02 ENCOUNTER — Other Ambulatory Visit: Payer: Self-pay

## 2021-08-02 ENCOUNTER — Ambulatory Visit: Payer: Medicare PPO | Admitting: Dermatology

## 2021-08-02 DIAGNOSIS — L578 Other skin changes due to chronic exposure to nonionizing radiation: Secondary | ICD-10-CM

## 2021-08-02 DIAGNOSIS — D1800 Hemangioma unspecified site: Secondary | ICD-10-CM

## 2021-08-02 DIAGNOSIS — L820 Inflamed seborrheic keratosis: Secondary | ICD-10-CM

## 2021-08-02 DIAGNOSIS — L821 Other seborrheic keratosis: Secondary | ICD-10-CM

## 2021-08-02 DIAGNOSIS — L814 Other melanin hyperpigmentation: Secondary | ICD-10-CM | POA: Diagnosis not present

## 2021-08-02 DIAGNOSIS — Z1283 Encounter for screening for malignant neoplasm of skin: Secondary | ICD-10-CM | POA: Diagnosis not present

## 2021-08-02 DIAGNOSIS — Z853 Personal history of malignant neoplasm of breast: Secondary | ICD-10-CM

## 2021-08-02 DIAGNOSIS — D229 Melanocytic nevi, unspecified: Secondary | ICD-10-CM

## 2021-08-02 NOTE — Patient Instructions (Addendum)

## 2021-08-02 NOTE — Progress Notes (Signed)
Follow-Up Visit   Subjective  Renee Vazquez is a 81 y.o. female who presents for the following: Annual Exam (Mole check, hx of Aks ). The patient presents for Total-Body Skin Exam (TBSE) for skin cancer screening and mole check.  The patient has spots, moles and lesions to be evaluated, some may be new or changing and the patient has concerns that these could be cancer.   The following portions of the chart were reviewed this encounter and updated as appropriate:   Tobacco   Allergies   Meds   Problems   Med Hx   Surg Hx   Fam Hx      Review of Systems:  No other skin or systemic complaints except as noted in HPI or Assessment and Plan.  Objective  Well appearing patient in no apparent distress; mood and affect are within normal limits.  A full examination was performed including scalp, head, eyes, ears, nose, lips, neck, chest, axillae, abdomen, back, buttocks, bilateral upper extremities, bilateral lower extremities, hands, feet, fingers, toes, fingernails, and toenails. All findings within normal limits unless otherwise noted below.  left chest x 1, back x 2, left breast x 2  (5) (5) Stuck-on, waxy, tan-brown papules   Right Breast Hx of breast cancer 15 years ago    Assessment & Plan  Inflamed seborrheic keratosis (5) left chest x 1, back x 2, left breast x 2  (5) Reassured benign age-related growth.  Recommend observation.  Discussed cryotherapy if spot(s) become irritated or inflamed.   Prior to procedure, discussed risks of blister formation, small wound, skin dyspigmentation, or rare scar following cryotherapy. Recommend Vaseline ointment to treated areas while healing.   Destruction of lesion - left chest x 1, back x 2, left breast x 2  (5) Complexity: simple   Destruction method: cryotherapy   Informed consent: discussed and consent obtained   Timeout:  patient name, date of birth, surgical site, and procedure verified Lesion destroyed using liquid nitrogen:  Yes   Region frozen until ice ball extended beyond lesion: Yes   Outcome: patient tolerated procedure well with no complications   Post-procedure details: wound care instructions given    History of breast cancer Right Breast No lymphadenopathy   Skin cancer screening  Lentigines - Scattered tan macules - Due to sun exposure - Benign-appearing, observe - Recommend daily broad spectrum sunscreen SPF 30+ to sun-exposed areas, reapply every 2 hours as needed. - Call for any changes  Seborrheic Keratoses - Stuck-on, waxy, tan-brown papules and/or plaques  - Benign-appearing - Discussed benign etiology and prognosis. - Observe - Call for any changes  Melanocytic Nevi - Tan-brown and/or pink-flesh-colored symmetric macules and papules - Benign appearing on exam today - Observation - Call clinic for new or changing moles - Recommend daily use of broad spectrum spf 30+ sunscreen to sun-exposed areas.   Hemangiomas - Red papules - Discussed benign nature - Observe - Call for any changes  Actinic Damage - Chronic condition, secondary to cumulative UV/sun exposure - diffuse scaly erythematous macules with underlying dyspigmentation - Recommend daily broad spectrum sunscreen SPF 30+ to sun-exposed areas, reapply every 2 hours as needed.  - Staying in the shade or wearing long sleeves, sun glasses (UVA+UVB protection) and wide brim hats (4-inch brim around the entire circumference of the hat) are also recommended for sun protection.  - Call for new or changing lesions.  Skin cancer screening performed today.   Return in about 1 year (around 08/02/2022)  for TBSE, hx of ISK, Aks .  IMarye Round, CMA, am acting as scribe for Sarina Ser, MD .  Documentation: I have reviewed the above documentation for accuracy and completeness, and I agree with the above.  Sarina Ser, MD

## 2021-08-05 ENCOUNTER — Encounter: Payer: Self-pay | Admitting: Internal Medicine

## 2021-08-05 DIAGNOSIS — R0989 Other specified symptoms and signs involving the circulatory and respiratory systems: Secondary | ICD-10-CM | POA: Insufficient documentation

## 2021-08-05 NOTE — Assessment & Plan Note (Signed)
Saw gyn 12/2018.  Unable to do pap secondary to atrophy.  Discussed further f/u or reevaluation.  Declines.

## 2021-08-05 NOTE — Assessment & Plan Note (Signed)
Recent covid.  Persistent symptoms as outlined.  Improved.  Check cxr.

## 2021-08-05 NOTE — Assessment & Plan Note (Signed)
Follow cbc.  

## 2021-08-05 NOTE — Assessment & Plan Note (Signed)
Mammogram 07/02/21 - [facility].  

## 2021-08-05 NOTE — Assessment & Plan Note (Signed)
Previously had ultrasound that revealed thickened endometrium.  S/p endometrial biopsy - insufficient tissue for diagnosis.  S/p hysteroscopy and D&C.  Reports no bleeding.  Desires no further w/up or evaluation.  

## 2021-08-05 NOTE — Assessment & Plan Note (Signed)
Discussed f/u bone density.  Agreeable to schedule.

## 2021-08-05 NOTE — Assessment & Plan Note (Signed)
Throat clearing as outlined.  Trial of pepcid.

## 2021-08-07 ENCOUNTER — Encounter: Payer: Self-pay | Admitting: Dermatology

## 2021-08-09 ENCOUNTER — Other Ambulatory Visit: Payer: Medicare PPO

## 2021-09-13 ENCOUNTER — Ambulatory Visit
Admission: RE | Admit: 2021-09-13 | Discharge: 2021-09-13 | Disposition: A | Discharge status: Home / Self Care | Payer: Medicare PPO | Source: Ambulatory Visit | Attending: Internal Medicine | Admitting: Internal Medicine

## 2021-09-13 ENCOUNTER — Other Ambulatory Visit: Payer: Self-pay

## 2021-09-13 DIAGNOSIS — E2839 Other primary ovarian failure: Secondary | ICD-10-CM | POA: Insufficient documentation

## 2021-09-13 DIAGNOSIS — M85852 Other specified disorders of bone density and structure, left thigh: Secondary | ICD-10-CM | POA: Diagnosis not present

## 2021-09-13 DIAGNOSIS — M85832 Other specified disorders of bone density and structure, left forearm: Secondary | ICD-10-CM | POA: Diagnosis not present

## 2021-11-05 ENCOUNTER — Ambulatory Visit (INDEPENDENT_AMBULATORY_CARE_PROVIDER_SITE_OTHER): Payer: Medicare PPO

## 2021-11-05 VITALS — Ht 62.0 in | Wt 129.0 lb

## 2021-11-05 DIAGNOSIS — Z0000 Encounter for general adult medical examination without abnormal findings: Secondary | ICD-10-CM | POA: Diagnosis not present

## 2021-11-05 NOTE — Patient Instructions (Addendum)
?  Ms. Renee Vazquez , ?Thank you for taking time to come for your Medicare Wellness Visit. I appreciate your ongoing commitment to your health goals. Please review the following plan we discussed and let me know if I can assist you in the future.  ? ?These are the goals we discussed: ? Goals   ? ?  ? Patient Stated  ?   Maintain healthy lifestyle (pt-stated)   ?   Stay active ?Healthy diet ?  ? ?  ?  ?This is a list of the screening recommended for you and due dates:  ?Health Maintenance  ?Topic Date Due  ? Flu Shot  01/22/2022  ? Tetanus Vaccine  12/15/2024  ? Pneumonia Vaccine  Completed  ? DEXA scan (bone density measurement)  Completed  ? COVID-19 Vaccine  Completed  ? Zoster (Shingles) Vaccine  Completed  ? HPV Vaccine  Aged Out  ?  ?

## 2021-11-05 NOTE — Progress Notes (Signed)
Subjective:   Renee Vazquez is a 81 y.o. female who presents for Medicare Annual (Subsequent) preventive examination.  Review of Systems    No ROS.  Medicare Wellness Virtual Visit.  Visual/audio telehealth visit, UTA vital signs.   See social history for additional risk factors.   Cardiac Risk Factors include: advanced age (>9men, >21 women)     Objective:    Today's Vitals   11/05/21 0907  Weight: 129 lb (58.5 kg)  Height: 5\' 2"  (1.575 m)   Body mass index is 23.59 kg/m.     11/05/2021    9:11 AM 11/03/2020    9:10 AM 11/03/2019    9:08 AM 11/02/2018    9:23 AM 09/18/2017    9:27 AM 09/17/2016    9:18 AM 09/18/2015    9:44 AM  Advanced Directives  Does Patient Have a Medical Advance Directive? Yes Yes Yes Yes Yes Yes Yes  Type of Estate agent of [place];Living will Healthcare Power of [place];Living will Healthcare Power of Attorney Living will;Healthcare Power of Attorney Living will;Healthcare Power of Attorney Living will;Healthcare Power of [employer] Power of [place];Living will  Does patient want to make changes to medical advance directive? No - Patient declined No - Patient declined No - Patient declined No - Patient declined No - Patient declined No - Patient declined   Copy of Healthcare Power of Attorney in Chart? No - copy requested No - copy requested No - copy requested No - copy requested No - copy requested No - copy requested No - copy requested    Current Medications (verified) Outpatient Encounter Medications as of 11/05/2021  Medication Sig   calcium citrate-vitamin D 200-200 MG-UNIT TABS Take 1 tablet by mouth daily.   cetirizine (ZYRTEC) 10 MG tablet Take 10 mg by mouth daily.   MELATONIN PO Take by mouth at bedtime as needed (sleep).   Multiple Vitamins-Minerals (CENTRUM SILVER ADULT 50+ PO) Take by mouth.   Multiple Vitamins-Minerals (PRESERVISION AREDS 2) CAPS Take 2 capsules by mouth daily.   No  facility-administered encounter medications on file as of 11/05/2021.    Allergies (verified) No known drug allergy   History: Past Medical History:  Diagnosis Date   Breast cancer (HCC)    Dr Jerelene Redden   Hypercholesterolemia    Osteopenia    Past Surgical History:  Procedure Laterality Date   BREAST LUMPECTOMY  2007   EYE SURGERY     detached retina   Family History  Problem Relation Age of Onset   Hypertension Mother    Hypercholesterolemia Mother    Prostate cancer Father    Breast cancer Maternal Aunt    Melanoma Sister    Vesicoureteral reflux Daughter    Colon cancer Neg Hx    Social History   Socioeconomic History   Marital status: Divorced    Spouse name: Not on file   Number of children: 4   Years of education: Not on file   Highest education level: Not on file  Occupational History   Occupation: retired  Tobacco Use   Smoking status: Never   Smokeless tobacco: Never  Vaping Use   Vaping Use: Never used  Substance and Sexual Activity   Alcohol use: Yes    Alcohol/week: 0.0 standard drinks    Comment: occasional   Drug use: No   Sexual activity: Not Currently    Birth control/protection: Post-menopausal  Other Topics Concern   Not on file  Social History  Narrative   Not on file   Social Determinants of Health   Financial Resource Strain: Low Risk    Difficulty of Paying Living Expenses: Not hard at all  Food Insecurity: No Food Insecurity   Worried About Programme researcher, broadcasting/film/video in the Last Year: Never true   Ran Out of Food in the Last Year: Never true  Transportation Needs: No Transportation Needs   Lack of Transportation (Medical): No   Lack of Transportation (Non-Medical): No  Physical Activity: Sufficiently Active   Days of Exercise per Week: 7 days   Minutes of Exercise per Session: 150+ min  Stress: No Stress Concern Present   Feeling of Stress : Not at all  Social Connections: Unknown   Frequency of Communication with  Friends and Family: More than three times a week   Frequency of Social Gatherings with Friends and Family: More than three times a week   Attends Religious Services: Not on Scientist, clinical (histocompatibility and immunogenetics) or Organizations: Not on file   Attends Banker Meetings: Not on file   Marital Status: Not on file    Tobacco Counseling Counseling given: Not Answered   Clinical Intake:  Pre-visit preparation completed: Yes        Diabetes: No  How often do you need to have someone help you when you read instructions, pamphlets, or other written materials from your doctor or pharmacy?: 1 - Never  Interpreter Needed?: No    Activities of Daily Living    11/05/2021    9:09 AM  In your present state of health, do you have any difficulty performing the following activities:  Hearing? 0  Vision? 0  Difficulty concentrating or making decisions? 0  Walking or climbing stairs? 0  Dressing or bathing? 0  Doing errands, shopping? 0  Preparing Food and eating ? N  Using the Toilet? N  In the past six months, have you accidently leaked urine? N  Do you have problems with loss of bowel control? N  Managing your Medications? N  Managing your Finances? N  Housekeeping or managing your Housekeeping? N    Patient Care Team: Dale Commerce, MD as PCP - General (Internal Medicine)  Indicate any recent Medical Services you may have received from other than [facility] providers in the past year (date may be approximate).     Assessment:   This is a routine wellness examination for [place].  Virtual Visit via Telephone Note  I connected with  Renee Vazquez on 11/05/21 at  9:00 AM EDT by telephone and verified that I am speaking with the correct person using two identifiers.  Persons participating in the virtual visit: patient/Nurse Health Advisor   I discussed the limitations of performing an evaluation and management service by telehealth. We continued and completed visit  with audio only.Some vital signs may be absent or patient reported.   Hearing/Vision screen Hearing Screening - Comments:: Patient is able to hear conversational tones without difficulty. No issues reported.  Vision Screening - Comments:: Followed by [facility]  Wears corrective lenses They have seen their ophthalmologist in the last 12 months.   Dietary issues and exercise activities discussed: Current Exercise Habits: Home exercise routine, Intensity: Mild   Goals Addressed               This Visit's Progress     Patient Stated     Maintain healthy lifestyle (pt-stated)   On track     Stay active Healthy  diet       Depression Screen    11/05/2021    9:08 AM 11/03/2020    9:47 AM 09/04/2020   11:42 AM 11/03/2019    9:09 AM 11/02/2018    9:29 AM 09/18/2017    9:15 AM 09/17/2016    9:20 AM  PHQ 2/9 Scores  PHQ - 2 Score 0 0 0 0 0 0 0    Fall Risk    11/05/2021    9:08 AM 11/03/2020    9:47 AM 09/04/2020   11:42 AM 11/03/2019    9:08 AM 07/08/2019   10:44 AM  Fall Risk   Falls in the past year? 0 0 0 0 0  Number falls in past yr: 0 0     Injury with Fall?  0     Follow up Falls evaluation completed Falls evaluation completed  Falls evaluation completed Falls evaluation completed    FALL RISK PREVENTION PERTAINING TO THE HOME: Home free of loose throw rugs in walkways, pet beds, electrical cords, etc? Yes  Adequate lighting in your home to reduce risk of falls? Yes   ASSISTIVE DEVICES UTILIZED TO PREVENT FALLS: Life alert? No  Use of a cane, walker or w/c? No   TIMED UP AND GO: Was the test performed? No .   Cognitive Function: Patient is alert and oriented x3.     11/03/2019    9:15 AM 09/17/2016    9:26 AM 09/18/2015    9:49 AM  MMSE - Mini Mental State Exam  Not completed: Unable to complete    Orientation to time  5 5  Orientation to Place  5 5  Registration  3 3  Attention/ Calculation  5 5  Recall  3 3  Language- name 2 objects  2 2  Language-  repeat  1 1  Language- follow 3 step command  3 3  Language- read & follow direction  1 1  Write a sentence  1 1  Copy design  1 1  Total score  30 30        11/02/2018    9:30 AM 09/18/2017    9:31 AM  6CIT Screen  What Year? 0 points 0 points  What month? 0 points 0 points  What time? 0 points 0 points  Count back from 20 0 points 0 points  Months in reverse 0 points 0 points  Repeat phrase 0 points 0 points  Total Score 0 points 0 points    Immunizations Immunization History  Administered Date(s) Administered   Influenza Split 04/15/2014   Influenza, High Dose Seasonal PF 04/11/2018   Influenza-Unspecified 04/06/2015, 04/22/2016, 03/24/2017, 03/29/2019, 03/24/2020, 03/24/2021   PFIZER Comirnaty(Gray Top)Covid-19 Tri-Sucrose Vaccine 11/07/2020   PFIZER(Purple Top)SARS-COV-2 Vaccination 06/30/2019, 07/21/2019, 02/14/2020   Pfizer Covid-19 Vaccine Bivalent Booster 32yrs & up 04/09/2021   Pneumococcal Conjugate-13 03/11/2014, 03/06/2018   Pneumococcal Polysaccharide-23 09/13/2015   Tdap 12/16/2014   Zoster Recombinat (Shingrix) 01/24/2021, 03/31/2021   Zoster, Live 08/17/2012   Screening Tests Health Maintenance  Topic Date Due   INFLUENZA VACCINE  01/22/2022   TETANUS/TDAP  12/15/2024   Pneumonia Vaccine 76+ Years old  Completed   DEXA SCAN  Completed   COVID-19 Vaccine  Completed   Zoster Vaccines- Shingrix  Completed   HPV VACCINES  Aged Out   Health Maintenance There are no preventive care reminders to display for this patient.  Lung Cancer Screening: (Low Dose CT Chest recommended if Age 65-80 years, 30 pack-year currently  smoking OR have quit w/in 15years.) does not qualify.   Hepatitis C Screening: does not qualify.  Vision Screening: Recommended annual ophthalmology exams for early detection of glaucoma and other disorders of the eye.  Dental Screening: Recommended annual dental exams for proper oral hygiene  Community Resource Referral / Chronic Care  Management: CRR required this visit?  No   CCM required this visit?  No      Plan:   Keep all routine maintenance appointments.   I have personally reviewed and noted the following in the patient's chart:   Medical and social history Use of alcohol, tobacco or illicit drugs  Current medications and supplements including opioid prescriptions.  Functional ability and status Nutritional status Physical activity Advanced directives List of other physicians Hospitalizations, surgeries, and ER visits in previous 12 months Vitals Screenings to include cognitive, depression, and falls Referrals and appointments  In addition, I have reviewed and discussed with patient certain preventive protocols, quality metrics, and best practice recommendations. A written personalized care plan for preventive services as well as general preventive health recommendations were provided to patient.     Ashok Pall, LPN   9/60/4540

## 2022-01-10 ENCOUNTER — Other Ambulatory Visit: Payer: Self-pay | Admitting: Internal Medicine

## 2022-01-10 ENCOUNTER — Inpatient Hospital Stay
Admission: RE | Admit: 2022-01-10 | Discharge: 2022-01-10 | Disposition: A | Discharge status: Home / Self Care | Payer: Self-pay | Source: Ambulatory Visit | Attending: *Deleted | Admitting: *Deleted

## 2022-01-10 ENCOUNTER — Other Ambulatory Visit: Payer: Self-pay | Admitting: *Deleted

## 2022-01-10 DIAGNOSIS — Z1231 Encounter for screening mammogram for malignant neoplasm of breast: Secondary | ICD-10-CM

## 2022-01-21 ENCOUNTER — Other Ambulatory Visit: Payer: Medicare PPO

## 2022-01-23 ENCOUNTER — Encounter: Payer: Medicare PPO | Admitting: Internal Medicine

## 2022-01-29 ENCOUNTER — Other Ambulatory Visit (INDEPENDENT_AMBULATORY_CARE_PROVIDER_SITE_OTHER): Payer: Medicare PPO

## 2022-01-29 DIAGNOSIS — E7800 Pure hypercholesterolemia, unspecified: Secondary | ICD-10-CM

## 2022-01-29 LAB — LIPID PANEL
Cholesterol: 193 mg/dL (ref 0–200)
HDL: 54.7 mg/dL (ref >39.00)
LDL Cholesterol: 113 mg/dL — ABNORMAL HIGH (ref 0–99)
NonHDL: 138.59
Total CHOL/HDL Ratio: 4
Triglycerides: 126 mg/dL (ref 0.0–149.0)
VLDL: 25.2 mg/dL (ref 0.0–40.0)

## 2022-01-29 LAB — TSH: TSH: 6.18 u[IU]/mL — ABNORMAL HIGH (ref 0.35–5.50)

## 2022-01-29 LAB — BASIC METABOLIC PANEL
BUN: 17 mg/dL (ref 6–23)
CO2: 29 mEq/L (ref 19–32)
Calcium: 9.4 mg/dL (ref 8.4–10.5)
Chloride: 102 mEq/L (ref 96–112)
Creatinine, Ser: 0.84 mg/dL (ref 0.40–1.20)
GFR: 65.67 mL/min (ref >60.00)
Glucose, Bld: 84 mg/dL (ref 70–99)
Potassium: 4.1 mEq/L (ref 3.5–5.1)
Sodium: 139 mEq/L (ref 135–145)

## 2022-01-29 LAB — HEPATIC FUNCTION PANEL
ALT: 12 U/L (ref 0–35)
AST: 18 U/L (ref 0–37)
Albumin: 3.5 g/dL (ref 3.5–5.2)
Alkaline Phosphatase: 56 U/L (ref 39–117)
Bilirubin, Direct: 0.1 mg/dL (ref 0.0–0.3)
Total Bilirubin: 0.6 mg/dL (ref 0.2–1.2)
Total Protein: 7.3 g/dL (ref 6.0–8.3)

## 2022-02-01 ENCOUNTER — Ambulatory Visit (INDEPENDENT_AMBULATORY_CARE_PROVIDER_SITE_OTHER): Payer: Medicare PPO | Admitting: Internal Medicine

## 2022-02-01 ENCOUNTER — Encounter: Payer: Self-pay | Admitting: Internal Medicine

## 2022-02-01 VITALS — BP 124/70 | HR 75 | Temp 97.6°F | Resp 16 | Ht 62.0 in | Wt 127.8 lb

## 2022-02-01 DIAGNOSIS — R9389 Abnormal findings on diagnostic imaging of other specified body structures: Secondary | ICD-10-CM | POA: Diagnosis not present

## 2022-02-01 DIAGNOSIS — E7800 Pure hypercholesterolemia, unspecified: Secondary | ICD-10-CM | POA: Diagnosis not present

## 2022-02-01 DIAGNOSIS — D649 Anemia, unspecified: Secondary | ICD-10-CM | POA: Diagnosis not present

## 2022-02-01 DIAGNOSIS — R87610 Atypical squamous cells of undetermined significance on cytologic smear of cervix (ASC-US): Secondary | ICD-10-CM | POA: Diagnosis not present

## 2022-02-01 DIAGNOSIS — Z853 Personal history of malignant neoplasm of breast: Secondary | ICD-10-CM

## 2022-02-01 DIAGNOSIS — R7989 Other specified abnormal findings of blood chemistry: Secondary | ICD-10-CM | POA: Diagnosis not present

## 2022-02-01 DIAGNOSIS — Z0000 Encounter for general adult medical examination without abnormal findings: Secondary | ICD-10-CM | POA: Diagnosis not present

## 2022-02-01 NOTE — Progress Notes (Signed)
Patient ID: Renee Vazquez, female   DOB: 1941/01/03, 81 y.o.   MRN: [id number]   Subjective:    Patient ID: Renee Vazquez, female    DOB: 14-Jan-1941, 81 y.o.   MRN: [id number]   Patient here for physical exam.  .   HPI Reports she is doing well.  Feels good.  Is back line dancing.  Feels better since starting.  No chest pain or sob reported.  No abdominal pain.  Bowels moving.     Past Medical History:  Diagnosis Date   Breast cancer ([place])    Dr Ewell Poe   Hypercholesterolemia    Osteopenia    Past Surgical History:  Procedure Laterality Date   BREAST LUMPECTOMY  2007   EYE SURGERY     detached retina   Family History  Problem Relation Age of Onset   Hypertension Mother    Hypercholesterolemia Mother    Prostate cancer Father    Breast cancer Maternal Aunt    Melanoma Sister    Vesicoureteral reflux Daughter    Colon cancer Neg Hx    Social History   Socioeconomic History   Marital status: Divorced    Spouse name: Not on file   Number of children: 4   Years of education: Not on file   Highest education level: Not on file  Occupational History   Occupation: retired  Tobacco Use   Smoking status: Never   Smokeless tobacco: Never  Vaping Use   Vaping Use: Never used  Substance and Sexual Activity   Alcohol use: Yes    Alcohol/week: 0.0 standard drinks of alcohol    Comment: occasional   Drug use: No   Sexual activity: Not Currently    Birth control/protection: Post-menopausal  Other Topics Concern   Not on file  Social History Narrative   Not on file   Social Determinants of Health   Financial Resource Strain: Low Risk  (11/05/2021)   Overall Financial Resource Strain (CARDIA)    Difficulty of Paying Living Expenses: Not hard at all  Food Insecurity: No Food Insecurity (11/05/2021)   Hunger Vital Sign    Worried About Running Out of Food in the Last Year: Never true    Ran Out of Food in the Last Year: Never true  Transportation  Needs: No Transportation Needs (11/05/2021)   PRAPARE - Hydrologist (Medical): No    Lack of Transportation (Non-Medical): No  Physical Activity: Sufficiently Active (11/05/2021)   Exercise Vital Sign    Days of Exercise per Week: 7 days    Minutes of Exercise per Session: 150+ min  Stress: No Stress Concern Present (11/05/2021)   [place]    Feeling of Stress : Not at all  Social Connections: Unknown (11/05/2021)   Social Connection and Isolation Panel [NHANES]    Frequency of Communication with Friends and Family: More than three times a week    Frequency of Social Gatherings with Friends and Family: More than three times a week    Attends Religious Services: Not on Advertising copywriter or Organizations: Not on file    Attends Archivist Meetings: Not on file    Marital Status: Not on file     Review of Systems  Constitutional:  Negative for appetite change and unexpected weight change.  HENT:  Negative for congestion, sinus pressure and sore throat.   Eyes:  Negative for pain and visual disturbance.  Respiratory:  Negative for cough, chest tightness and shortness of breath.   Cardiovascular:  Negative for chest pain, palpitations and leg swelling.  Gastrointestinal:  Negative for abdominal pain, diarrhea, nausea and vomiting.  Genitourinary:  Negative for difficulty urinating and dysuria.  Musculoskeletal:  Negative for joint swelling and myalgias.  Skin:  Negative for color change and rash.  Neurological:  Negative for dizziness, light-headedness and headaches.  Hematological:  Negative for adenopathy. Does not bruise/bleed easily.  Psychiatric/Behavioral:  Negative for agitation and dysphoric mood.        Objective:     BP 124/70 (BP Location: Left Arm, Patient Position: Sitting, Cuff Size: Small)   Pulse 75   Temp 97.6 F (36.4 C) (Temporal)   Resp 16    Ht '5\' 2"'$  (1.575 m)   Wt 127 lb 12.8 oz (58 kg)   SpO2 98%   BMI 23.37 kg/m  Wt Readings from Last 3 Encounters:  02/01/22 127 lb 12.8 oz (58 kg)  11/05/21 129 lb (58.5 kg)  07/23/21 129 lb 12.8 oz (58.9 kg)    Physical Exam Vitals reviewed.  Constitutional:      General: She is not in acute distress.    Appearance: Normal appearance.  HENT:     Head: Normocephalic and atraumatic.     Right Ear: External ear normal.     Left Ear: External ear normal.  Eyes:     General: No scleral icterus.       Right eye: No discharge.        Left eye: No discharge.     Conjunctiva/sclera: Conjunctivae normal.  Neck:     Thyroid: No thyromegaly.  Cardiovascular:     Rate and Rhythm: Normal rate and regular rhythm.  Pulmonary:     Effort: No respiratory distress.     Breath sounds: Normal breath sounds. No wheezing.  Abdominal:     General: Bowel sounds are normal.     Palpations: Abdomen is soft.     Tenderness: There is no abdominal tenderness.  Musculoskeletal:        General: No swelling or tenderness.     Cervical back: Neck supple. No tenderness.  Lymphadenopathy:     Cervical: No cervical adenopathy.  Skin:    Findings: No erythema or rash.  Neurological:     Mental Status: She is alert.  Psychiatric:        Mood and Affect: Mood normal.        Behavior: Behavior normal.      Outpatient Encounter Medications as of 02/01/2022  Medication Sig   calcium citrate-vitamin D 200-200 MG-UNIT TABS Take 1 tablet by mouth daily.   cetirizine (ZYRTEC) 10 MG tablet Take 10 mg by mouth daily.   MELATONIN PO Take by mouth at bedtime as needed (sleep).   Multiple Vitamins-Minerals (CENTRUM SILVER ADULT 50+ PO) Take by mouth.   Multiple Vitamins-Minerals (PRESERVISION AREDS 2) CAPS Take 2 capsules by mouth daily.   No facility-administered encounter medications on file as of 02/01/2022.     Lab Results  Component Value Date   WBC 5.1 07/20/2021   HGB 13.5 07/20/2021   HCT 40.8  07/20/2021   PLT 277.0 07/20/2021   GLUCOSE 84 01/29/2022   CHOL 193 01/29/2022   TRIG 126.0 01/29/2022   HDL 54.70 01/29/2022   LDLDIRECT 152.4 08/16/2013   LDLCALC 113 (H) 01/29/2022   ALT 12 01/29/2022   AST 18 01/29/2022  NA 139 01/29/2022   K 4.1 01/29/2022   CL 102 01/29/2022   CREATININE 0.84 01/29/2022   BUN 17 01/29/2022   CO2 29 01/29/2022   TSH 6.18 (H) 01/29/2022    MM Outside Films Mammo  Result Date: 01/10/2022 This examination belongs to an outside facility and is stored here for comparison purposes only.  Contact the originating outside institution for any associated report or interpretation.  MM Outside Films Mammo  Result Date: 01/10/2022 This examination belongs to an outside facility and is stored here for comparison purposes only.  Contact the originating outside institution for any associated report or interpretation.  MM Outside Films Mammo  Result Date: 01/10/2022 This examination belongs to an outside facility and is stored here for comparison purposes only.  Contact the originating outside institution for any associated report or interpretation.  MM Outside Films Mammo  Result Date: 01/10/2022 This examination belongs to an outside facility and is stored here for comparison purposes only.  Contact the originating outside institution for any associated report or interpretation.      Assessment & Plan:   Problem List Items Addressed This Visit     Abnormal Pap smear of cervix    Saw gyn 12/2018.  Unable to do pap secondary to atrophy.  Have discussed further f/u or reevaluation.  Continues to decline.       Anemia    Follow cbc.        Elevated TSH - Primary    Slightly elevated tsh on recheck lab check.  Recheck tsh in 6- 8 weeks.       Relevant Orders   TSH   T4, free   Health care maintenance    Physical today 02/01/22.  Mammogram 07/02/21 - [facility]).  Colonoscopy 08/2013.  Recommended f/u in 10 years.  Has declined hemoccult  cards.       History of breast cancer    Mammogram 07/02/21 - [facility].       Hypercholesterolemia     Discussed recommendation for cholesterol medication.  She declines.  Follow lipid panel.       Thickened endometrium    Previously had ultrasound that revealed thickened endometrium.  S/p endometrial biopsy - insufficient tissue for diagnosis.  S/p hysteroscopy and D&C.  Reports no bleeding.  Desires no further w/up or evaluation.         Einar Pheasant, MD

## 2022-02-10 ENCOUNTER — Encounter: Payer: Self-pay | Admitting: Internal Medicine

## 2022-02-10 NOTE — Assessment & Plan Note (Signed)
Physical today 02/01/22.  Mammogram 07/02/21 - [facility] II [facility]).  Colonoscopy 08/2013.  Recommended f/u in 10 years.  Has declined hemoccult cards.

## 2022-02-10 NOTE — Assessment & Plan Note (Signed)
Mammogram 07/02/21 - Birads II.

## 2022-02-10 NOTE — Assessment & Plan Note (Signed)
Slightly elevated tsh on recheck lab check.  Recheck tsh in 6- 8 weeks.

## 2022-02-10 NOTE — Assessment & Plan Note (Signed)
Previously had ultrasound that revealed thickened endometrium.  S/p endometrial biopsy - insufficient tissue for diagnosis.  S/p hysteroscopy and D&C.  Reports no bleeding.  Desires no further w/up or evaluation.  

## 2022-02-10 NOTE — Assessment & Plan Note (Addendum)
Saw gyn 12/2018.  Unable to do pap secondary to atrophy.  Have discussed further f/u or reevaluation.  Continues to decline.  

## 2022-02-10 NOTE — Assessment & Plan Note (Signed)
Follow cbc.  

## 2022-02-10 NOTE — Assessment & Plan Note (Signed)
Discussed recommendation for cholesterol medication.  She declines.  Follow lipid panel.

## 2022-02-28 DIAGNOSIS — Z0100 Encounter for examination of eyes and vision without abnormal findings: Secondary | ICD-10-CM | POA: Diagnosis not present

## 2022-02-28 DIAGNOSIS — H353131 Nonexudative age-related macular degeneration, bilateral, early dry stage: Secondary | ICD-10-CM | POA: Diagnosis not present

## 2022-04-03 ENCOUNTER — Other Ambulatory Visit: Payer: Medicare PPO

## 2022-04-11 ENCOUNTER — Other Ambulatory Visit (INDEPENDENT_AMBULATORY_CARE_PROVIDER_SITE_OTHER): Payer: Medicare PPO

## 2022-04-11 DIAGNOSIS — R7989 Other specified abnormal findings of blood chemistry: Secondary | ICD-10-CM | POA: Diagnosis not present

## 2022-04-11 LAB — TSH: TSH: 3.35 u[IU]/mL (ref 0.35–5.50)

## 2022-04-15 DIAGNOSIS — Z6823 Body mass index (BMI) 23.0-23.9, adult: Secondary | ICD-10-CM | POA: Diagnosis not present

## 2022-04-15 DIAGNOSIS — H6121 Impacted cerumen, right ear: Secondary | ICD-10-CM | POA: Diagnosis not present

## 2022-04-15 DIAGNOSIS — H66001 Acute suppurative otitis media without spontaneous rupture of ear drum, right ear: Secondary | ICD-10-CM | POA: Diagnosis not present

## 2022-04-22 DIAGNOSIS — M7061 Trochanteric bursitis, right hip: Secondary | ICD-10-CM | POA: Diagnosis not present

## 2022-04-23 ENCOUNTER — Encounter: Payer: Self-pay | Admitting: Internal Medicine

## 2022-04-23 DIAGNOSIS — M7061 Trochanteric bursitis, right hip: Secondary | ICD-10-CM | POA: Insufficient documentation

## 2022-07-03 ENCOUNTER — Ambulatory Visit
Admission: RE | Admit: 2022-07-03 | Discharge: 2022-07-03 | Disposition: A | Discharge status: Home / Self Care | Payer: Medicare PPO | Source: Ambulatory Visit | Attending: Internal Medicine | Admitting: Internal Medicine

## 2022-07-03 DIAGNOSIS — Z1231 Encounter for screening mammogram for malignant neoplasm of breast: Secondary | ICD-10-CM | POA: Insufficient documentation

## 2022-07-03 HISTORY — DX: Personal history of antineoplastic chemotherapy: Z92.21

## 2022-07-03 HISTORY — DX: Personal history of irradiation: Z92.3

## 2022-08-02 ENCOUNTER — Other Ambulatory Visit (INDEPENDENT_AMBULATORY_CARE_PROVIDER_SITE_OTHER): Payer: Medicare PPO

## 2022-08-02 DIAGNOSIS — R7989 Other specified abnormal findings of blood chemistry: Secondary | ICD-10-CM | POA: Diagnosis not present

## 2022-08-02 LAB — T4, FREE: Free T4: 0.9 ng/dL (ref 0.60–1.60)

## 2022-08-05 ENCOUNTER — Telehealth: Payer: Self-pay | Admitting: Internal Medicine

## 2022-08-05 ENCOUNTER — Encounter: Payer: Self-pay | Admitting: Internal Medicine

## 2022-08-05 ENCOUNTER — Ambulatory Visit: Payer: Medicare PPO | Admitting: Internal Medicine

## 2022-08-05 VITALS — BP 118/70 | HR 80 | Temp 97.9°F | Resp 16 | Ht 62.0 in | Wt 130.8 lb

## 2022-08-05 DIAGNOSIS — M7061 Trochanteric bursitis, right hip: Secondary | ICD-10-CM

## 2022-08-05 DIAGNOSIS — R87610 Atypical squamous cells of undetermined significance on cytologic smear of cervix (ASC-US): Secondary | ICD-10-CM | POA: Diagnosis not present

## 2022-08-05 DIAGNOSIS — R319 Hematuria, unspecified: Secondary | ICD-10-CM

## 2022-08-05 DIAGNOSIS — D649 Anemia, unspecified: Secondary | ICD-10-CM

## 2022-08-05 DIAGNOSIS — R9389 Abnormal findings on diagnostic imaging of other specified body structures: Secondary | ICD-10-CM | POA: Diagnosis not present

## 2022-08-05 DIAGNOSIS — Z853 Personal history of malignant neoplasm of breast: Secondary | ICD-10-CM

## 2022-08-05 DIAGNOSIS — E7800 Pure hypercholesterolemia, unspecified: Secondary | ICD-10-CM

## 2022-08-05 NOTE — Telephone Encounter (Signed)
Labs are in to be drawn end of Feb. Will order labs prior to physical after she has fasting labs drawn

## 2022-08-05 NOTE — Telephone Encounter (Signed)
During check out pt, requested a fasting labs appt before her physical in August, I did scheduled her but after her labs in 2 weeks an order has to be in for that.

## 2022-08-05 NOTE — Progress Notes (Signed)
Subjective:    Patient ID: Renee Vazquez, female    DOB: [birth year], 82 y.o.   MRN: [id number]  Patient here for  Chief Complaint  Patient presents with   Medical Management of Chronic Issues    HPI Here to follow up regarding hypercholesterolemia.  Reports she is doing relatively well.  Still dancing and doing chair yoga. No chest pain or sob reported.  No abdominal pain. Bowels moving.  Mid summer - right hip issues.  Would roll over - pain.  Saw ortho.  Trochanteric bursitis.  S/p steroids.   Overall doing well.  Planning cruise 09/2022.     Past Medical History:  Diagnosis Date   Breast cancer ([place]) 2007   Dr Ewell Poe   Hypercholesterolemia    Osteopenia    Personal history of chemotherapy    Personal history of radiation therapy    Past Surgical History:  Procedure Laterality Date   BREAST LUMPECTOMY  2007   EYE SURGERY     detached retina   Family History  Problem Relation Age of Onset   Hypertension Mother    Hypercholesterolemia Mother    Prostate cancer Father    Breast cancer Maternal Aunt    Melanoma Sister    Vesicoureteral reflux Daughter    Colon cancer Neg Hx    Social History   Socioeconomic History   Marital status: Divorced    Spouse name: Not on file   Number of children: 4   Years of education: Not on file   Highest education level: Not on file  Occupational History   Occupation: retired  Tobacco Use   Smoking status: Never   Smokeless tobacco: Never  Vaping Use   Vaping Use: Never used  Substance and Sexual Activity   Alcohol use: Yes    Alcohol/week: 0.0 standard drinks of alcohol    Comment: occasional   Drug use: No   Sexual activity: Not Currently    Birth control/protection: Post-menopausal  Other Topics Concern   Not on file  Social History Narrative   Not on file   Social Determinants of Health   Financial Resource Strain: Low Risk  (11/05/2021)   Overall Financial Resource Strain (CARDIA)     Difficulty of Paying Living Expenses: Not hard at all  Food Insecurity: No Food Insecurity (11/05/2021)   Hunger Vital Sign    Worried About Running Out of Food in the Last Year: Never true    Ran Out of Food in the Last Year: Never true  Transportation Needs: No Transportation Needs (11/05/2021)   PRAPARE - Hydrologist (Medical): No    Lack of Transportation (Non-Medical): No  Physical Activity: Sufficiently Active (11/05/2021)   Exercise Vital Sign    Days of Exercise per Week: 7 days    Minutes of Exercise per Session: 150+ min  Stress: No Stress Concern Present (11/05/2021)   Birdsong    Feeling of Stress : Not at all  Social Connections: Unknown (11/05/2021)   Social Connection and Isolation Panel [NHANES]    Frequency of Communication with Friends and Family: More than three times a week    Frequency of Social Gatherings with Friends and Family: More than three times a week    Attends Religious Services: Not on file    Active Member of Clubs or Organizations: Not on file    Attends Archivist Meetings: Not on file  Marital Status: Not on file     Review of Systems  Constitutional:  Negative for appetite change and unexpected weight change.  HENT:  Negative for congestion and sinus pressure.   Respiratory:  Negative for cough, chest tightness and shortness of breath.   Cardiovascular:  Negative for chest pain, palpitations and leg swelling.  Gastrointestinal:  Negative for abdominal pain, diarrhea, nausea and vomiting.  Genitourinary:  Negative for difficulty urinating and dysuria.  Musculoskeletal:  Negative for joint swelling and myalgias.  Skin:  Negative for color change and rash.  Neurological:  Negative for dizziness and headaches.  Psychiatric/Behavioral:  Negative for agitation and dysphoric mood.        Objective:     BP 118/70   Pulse 80   Temp 97.9 F  (36.6 C)   Resp 16   Ht 5' 2"$  (1.575 m)   Wt 130 lb 12.8 oz (59.3 kg)   SpO2 98%   BMI 23.92 kg/m  Wt Readings from Last 3 Encounters:  08/05/22 130 lb 12.8 oz (59.3 kg)  02/01/22 127 lb 12.8 oz (58 kg)  11/05/21 129 lb (58.5 kg)    Physical Exam Vitals reviewed.  Constitutional:      General: She is not in acute distress.    Appearance: Normal appearance.  HENT:     Head: Normocephalic and atraumatic.     Right Ear: External ear normal.     Left Ear: External ear normal.  Eyes:     General: No scleral icterus.       Right eye: No discharge.        Left eye: No discharge.     Conjunctiva/sclera: Conjunctivae normal.  Neck:     Thyroid: No thyromegaly.  Cardiovascular:     Rate and Rhythm: Normal rate and regular rhythm.  Pulmonary:     Effort: No respiratory distress.     Breath sounds: Normal breath sounds. No wheezing.  Abdominal:     General: Bowel sounds are normal.     Palpations: Abdomen is soft.     Tenderness: There is no abdominal tenderness.  Musculoskeletal:        General: No swelling or tenderness.     Cervical back: Neck supple. No tenderness.  Lymphadenopathy:     Cervical: No cervical adenopathy.  Skin:    Findings: No erythema or rash.  Neurological:     Mental Status: She is alert.  Psychiatric:        Mood and Affect: Mood normal.        Behavior: Behavior normal.      Outpatient Encounter Medications as of 08/05/2022  Medication Sig   calcium citrate-vitamin D 200-200 MG-UNIT TABS Take 1 tablet by mouth daily.   cetirizine (ZYRTEC) 10 MG tablet Take 10 mg by mouth daily.   MELATONIN PO Take by mouth at bedtime as needed (sleep).   Multiple Vitamins-Minerals (CENTRUM SILVER ADULT 50+ PO) Take by mouth.   Multiple Vitamins-Minerals (PRESERVISION AREDS 2) CAPS Take 2 capsules by mouth daily.   No facility-administered encounter medications on file as of 08/05/2022.     Lab Results  Component Value Date   WBC 5.1 07/20/2021   HGB 13.5  07/20/2021   HCT 40.8 07/20/2021   PLT 277.0 07/20/2021   GLUCOSE 84 01/29/2022   CHOL 193 01/29/2022   TRIG 126.0 01/29/2022   HDL 54.70 01/29/2022   LDLDIRECT 152.4 08/16/2013   LDLCALC 113 (H) 01/29/2022   ALT 12 01/29/2022  AST 18 01/29/2022   NA 139 01/29/2022   K 4.1 01/29/2022   CL 102 01/29/2022   CREATININE 0.84 01/29/2022   BUN 17 01/29/2022   CO2 29 01/29/2022   TSH 3.35 04/11/2022    MM 3D SCREEN BREAST BILATERAL  Result Date: 07/03/2022 CLINICAL DATA:  Screening. EXAM: DIGITAL SCREENING BILATERAL MAMMOGRAM WITH TOMOSYNTHESIS AND CAD TECHNIQUE: Bilateral screening digital craniocaudal and mediolateral oblique mammograms were obtained. Bilateral screening digital breast tomosynthesis was performed. The images were evaluated with computer-aided detection. COMPARISON:  Previous exam(s). ACR Breast Density Category a: The breast tissue is almost entirely fatty. FINDINGS: There are no findings suspicious for malignancy. IMPRESSION: No mammographic evidence of malignancy. A result letter of this screening mammogram will be mailed directly to the patient. RECOMMENDATION: Screening mammogram in one year. (Code:SM-B-01Y) BI-RADS CATEGORY  1: Negative. Electronically Signed   By: Everlean Alstrom M.D.   On: 07/03/2022 12:56       Assessment & Plan:  Hypercholesterolemia Assessment & Plan:  Discussed recommendation for cholesterol medication.  She declines.  Wants to continue diet and exercise. Follow lipid panel.   Orders: -     Hepatic function panel; Future -     Lipid panel; Future  Anemia, unspecified type Assessment & Plan: Follow cbc.    Orders: -     CBC with Differential/Platelet; Future -     Basic metabolic panel; Future  Atypical squamous cells of undetermined significance on cytologic smear of cervix (ASC-US) Assessment & Plan: Saw gyn 12/2018.  Unable to do pap secondary to atrophy.  Have discussed further f/u or reevaluation.  Continues to decline.     Hematuria, unspecified type Assessment & Plan: Will check urine with next labs.  Evaluated by gyn.  See above.  Follow.  No further bleeding.    Orders: -     Urinalysis, Routine w reflex microscopic; Future  History of breast cancer Assessment & Plan: Mammogram 07/03/22 - Birads I.    Thickened endometrium Assessment & Plan: Previously had ultrasound that revealed thickened endometrium.  S/p endometrial biopsy - insufficient tissue for diagnosis.  S/p hysteroscopy and D&C.  Reports no bleeding.  Desires no further w/up or evaluation.    Trochanteric bursitis of right hip Assessment & Plan: Dr Mack Guise (03/2022) - steroids.  Doing better.       Einar Pheasant, MD

## 2022-08-11 ENCOUNTER — Encounter: Payer: Self-pay | Admitting: Internal Medicine

## 2022-08-11 NOTE — Assessment & Plan Note (Signed)
Previously had ultrasound that revealed thickened endometrium.  S/p endometrial biopsy - insufficient tissue for diagnosis.  S/p hysteroscopy and D&C.  Reports no bleeding.  Desires no further w/up or evaluation.

## 2022-08-11 NOTE — Assessment & Plan Note (Signed)
Follow cbc.  

## 2022-08-11 NOTE — Assessment & Plan Note (Signed)
Dr Mack Guise (03/2022) - steroids.  Doing better.

## 2022-08-11 NOTE — Assessment & Plan Note (Signed)
Saw gyn 12/2018.  Unable to do pap secondary to atrophy.  Have discussed further f/u or reevaluation.  Continues to decline.

## 2022-08-11 NOTE — Assessment & Plan Note (Addendum)
Discussed recommendation for cholesterol medication.  She declines.  Wants to continue diet and exercise. Follow lipid panel.

## 2022-08-11 NOTE — Assessment & Plan Note (Signed)
Mammogram 07/03/22 - Birads I.

## 2022-08-11 NOTE — Assessment & Plan Note (Signed)
Will check urine with next labs.  Evaluated by gyn.  See above.  Follow.  No further bleeding.

## 2022-08-20 ENCOUNTER — Other Ambulatory Visit (INDEPENDENT_AMBULATORY_CARE_PROVIDER_SITE_OTHER): Payer: Medicare PPO

## 2022-08-20 DIAGNOSIS — R319 Hematuria, unspecified: Secondary | ICD-10-CM

## 2022-08-20 DIAGNOSIS — D649 Anemia, unspecified: Secondary | ICD-10-CM | POA: Diagnosis not present

## 2022-08-20 DIAGNOSIS — E7800 Pure hypercholesterolemia, unspecified: Secondary | ICD-10-CM | POA: Diagnosis not present

## 2022-08-20 LAB — CBC WITH DIFFERENTIAL/PLATELET
Basophils Absolute: 0.1 10*3/uL (ref 0.0–0.1)
Basophils Relative: 1 % (ref 0.0–3.0)
Eosinophils Absolute: 0.2 10*3/uL (ref 0.0–0.7)
Eosinophils Relative: 3.5 % (ref 0.0–5.0)
HCT: 38.2 % (ref 36.0–46.0)
Hemoglobin: 13.1 g/dL (ref 12.0–15.0)
Lymphocytes Relative: 43.6 % (ref 12.0–46.0)
Lymphs Abs: 2.2 10*3/uL (ref 0.7–4.0)
MCHC: 34.2 g/dL (ref 30.0–36.0)
MCV: 93.2 fl (ref 78.0–100.0)
Monocytes Absolute: 0.5 10*3/uL (ref 0.1–1.0)
Monocytes Relative: 10 % (ref 3.0–12.0)
Neutro Abs: 2.2 10*3/uL (ref 1.4–7.7)
Neutrophils Relative %: 41.9 % — ABNORMAL LOW (ref 43.0–77.0)
Platelets: 256 10*3/uL (ref 150.0–400.0)
RBC: 4.09 Mil/uL (ref 3.87–5.11)
RDW: 13.2 % (ref 11.5–15.5)
WBC: 5.1 10*3/uL (ref 4.0–10.5)

## 2022-08-20 LAB — BASIC METABOLIC PANEL
BUN: 15 mg/dL (ref 6–23)
CO2: 28 mEq/L (ref 19–32)
Calcium: 9.4 mg/dL (ref 8.4–10.5)
Chloride: 103 mEq/L (ref 96–112)
Creatinine, Ser: 0.86 mg/dL (ref 0.40–1.20)
GFR: 63.59 mL/min (ref >60.00)
Glucose, Bld: 88 mg/dL (ref 70–99)
Potassium: 4 mEq/L (ref 3.5–5.1)
Sodium: 137 mEq/L (ref 135–145)

## 2022-08-20 LAB — HEPATIC FUNCTION PANEL
ALT: 12 U/L (ref 0–35)
AST: 19 U/L (ref 0–37)
Albumin: 3.3 g/dL — ABNORMAL LOW (ref 3.5–5.2)
Alkaline Phosphatase: 55 U/L (ref 39–117)
Bilirubin, Direct: 0.1 mg/dL (ref 0.0–0.3)
Total Bilirubin: 0.6 mg/dL (ref 0.2–1.2)
Total Protein: 7.2 g/dL (ref 6.0–8.3)

## 2022-08-20 LAB — LIPID PANEL
Cholesterol: 189 mg/dL (ref 0–200)
HDL: 65.6 mg/dL (ref >39.00)
LDL Cholesterol: 106 mg/dL — ABNORMAL HIGH (ref 0–99)
NonHDL: 123.32
Total CHOL/HDL Ratio: 3
Triglycerides: 86 mg/dL (ref 0.0–149.0)
VLDL: 17.2 mg/dL (ref 0.0–40.0)

## 2022-08-21 ENCOUNTER — Telehealth: Payer: Self-pay

## 2022-08-21 LAB — URINALYSIS, ROUTINE W REFLEX MICROSCOPIC
Bilirubin Urine: NEGATIVE
Hgb urine dipstick: NEGATIVE
Ketones, ur: NEGATIVE
Nitrite: NEGATIVE
RBC / HPF: NONE SEEN (ref 0–?)
Specific Gravity, Urine: 1.025 (ref 1.000–1.030)
Total Protein, Urine: NEGATIVE
Urine Glucose: NEGATIVE
Urobilinogen, UA: 0.2 (ref 0.0–1.0)
pH: 6 (ref 5.0–8.0)

## 2022-08-21 NOTE — Telephone Encounter (Signed)
Noted  

## 2022-08-21 NOTE — Telephone Encounter (Signed)
Lm for pt to cb re ::   Renee Pheasant, MD  [facility] - -total and bad cholesterol have decreased some from last check - improved from the last couple of checks.  She has declined cholesterol medication.  Continue diet and exercise.  We will continue to follow.  Hgb and liver function tests wnl.  Kidney function ok.

## 2022-08-21 NOTE — Telephone Encounter (Signed)
Patient returned office phone call and Dr Bary Leriche note was read.

## 2022-09-12 ENCOUNTER — Ambulatory Visit: Payer: Medicare PPO | Admitting: Dermatology

## 2022-09-12 ENCOUNTER — Encounter: Payer: Self-pay | Admitting: Dermatology

## 2022-09-12 VITALS — BP 162/90 | HR 78

## 2022-09-12 DIAGNOSIS — D2239 Melanocytic nevi of other parts of face: Secondary | ICD-10-CM

## 2022-09-12 DIAGNOSIS — D229 Melanocytic nevi, unspecified: Secondary | ICD-10-CM

## 2022-09-12 DIAGNOSIS — Z1283 Encounter for screening for malignant neoplasm of skin: Secondary | ICD-10-CM | POA: Diagnosis not present

## 2022-09-12 DIAGNOSIS — L820 Inflamed seborrheic keratosis: Secondary | ICD-10-CM | POA: Diagnosis not present

## 2022-09-12 DIAGNOSIS — L821 Other seborrheic keratosis: Secondary | ICD-10-CM | POA: Diagnosis not present

## 2022-09-12 DIAGNOSIS — D1801 Hemangioma of skin and subcutaneous tissue: Secondary | ICD-10-CM

## 2022-09-12 DIAGNOSIS — L578 Other skin changes due to chronic exposure to nonionizing radiation: Secondary | ICD-10-CM

## 2022-09-12 DIAGNOSIS — D692 Other nonthrombocytopenic purpura: Secondary | ICD-10-CM | POA: Diagnosis not present

## 2022-09-12 DIAGNOSIS — L814 Other melanin hyperpigmentation: Secondary | ICD-10-CM

## 2022-09-12 NOTE — Progress Notes (Signed)
   Follow-Up Visit   Subjective  Renee Vazquez is a 82 y.o. female who presents for the following: Skin Cancer Screening and Full Body Skin Exam. No personal Hx of skin cancer or dysplastic nevi. Hx of AKs  The patient presents for Total-Body Skin Exam (TBSE) for skin cancer screening and mole check. The patient has spots, moles and lesions to be evaluated, some may be new or changing and the patient has concerns that these could be cancer.    The following portions of the chart were reviewed this encounter and updated as appropriate: medications, allergies, medical history  Review of Systems:  No other skin or systemic complaints except as noted in HPI or Assessment and Plan.  Objective  Well appearing patient in no apparent distress; mood and affect are within normal limits.  A full examination was performed including scalp, head, eyes, ears, nose, lips, neck, chest, axillae, abdomen, back, buttocks, bilateral upper extremities, bilateral lower extremities, hands, feet, fingers, toes, fingernails, and toenails. All findings within normal limits unless otherwise noted below.      Assessment & Plan    Lentigines, Seborrheic Keratoses face, back, legs, arms, Hemangiomas torso - Benign normal skin lesions - Benign-appearing - Call for any changes  Melanocytic Nevi - Tan-brown and/or pink-flesh-colored symmetric macules and papules - Benign appearing on exam today - Observation - Call clinic for new or changing moles - Recommend daily use of broad spectrum spf 30+ sunscreen to sun-exposed areas.   Actinic Damage - Chronic condition, secondary to cumulative UV/sun exposure - diffuse scaly erythematous macules with underlying dyspigmentation - Recommend daily broad spectrum sunscreen SPF 30+ to sun-exposed areas, reapply every 2 hours as needed.  - Staying in the shade or wearing long sleeves, sun glasses (UVA+UVB protection) and wide brim hats (4-inch brim around the entire  circumference of the hat) are also recommended for sun protection.  - Call for new or changing lesions.  Irritated Seborrheic Keratoses. Back. Discussed LN2. Patient deferred treatment at this time. Will continue OTC HC cream as needed for itching.  - Stuck-on, waxy, tan-brown papules and/or plaques, some with erythema and crusting - Benign-appearing - Discussed benign etiology and prognosis. - Observe - Call for any changes  Purpura - Chronic; persistent and recurrent.  Treatable, but not curable. Legs- h/o recent trauma - Violaceous macules and patches - Benign - Related to trauma, age, sun damage and/or use of blood thinners, chronic use of topical and/or oral steroids - Observe - Can use OTC arnica containing moisturizer such as Dermend Bruise Formula if desired - Call for worsening or other concerns   Melanocytic Nevi 5 mm pink flesh papule at right cheek.  Patient states has been there at least 20 years.  - Tan-brown and/or pink-flesh-colored symmetric macules and papules - Benign appearing on exam today, thin serpentine vessels on dermoscopy - Observation - Call clinic for new or changing moles - Recommend daily use of broad spectrum spf 30+ sunscreen to sun-exposed areas.  Will RTC for shave removal (pt has upcoming cruise). DDX: Nevus vrs NF, R/O BCC.     Skin cancer screening performed today.  Return in about 1 year (around 09/12/2023) for TBSE. Sooner for shave removal lesion R cheek.  I, Emelia Salisbury, CMA, am acting as scribe for Brendolyn Patty, MD.   Documentation: I have reviewed the above documentation for accuracy and completeness, and I agree with the above.  Brendolyn Patty, MD

## 2022-09-12 NOTE — Patient Instructions (Signed)
Recommend daily broad spectrum sunscreen SPF 30+ to sun-exposed areas, reapply every 2 hours as needed. Call for new or changing lesions.  Staying in the shade or wearing long sleeves, sun glasses (UVA+UVB protection) and wide brim hats (4-inch brim around the entire circumference of the hat) are also recommended for sun protection.    Melanoma ABCDEs  Melanoma is the most dangerous type of skin cancer, and is the leading cause of death from skin disease.  You are more likely to develop melanoma if you: Have light-colored skin, light-colored eyes, or red or blond hair Spend a lot of time in the sun Tan regularly, either outdoors or in a tanning bed Have had blistering sunburns, especially during childhood Have a close family member who has had a melanoma Have atypical moles or large birthmarks  Early detection of melanoma is key since treatment is typically straightforward and cure rates are extremely high if we catch it early.   The first sign of melanoma is often a change in a mole or a new dark spot.  The ABCDE system is a way of remembering the signs of melanoma.  A for asymmetry:  The two halves do not match. B for border:  The edges of the growth are irregular. C for color:  A mixture of colors are present instead of an even brown color. D for diameter:  Melanomas are usually (but not always) greater than 6mm - the size of a pencil eraser. E for evolution:  The spot keeps changing in size, shape, and color.  Please check your skin once per month between visits. You can use a small mirror in front and a large mirror behind you to keep an eye on the back side or your body.   If you see any new or changing lesions before your next follow-up, please call to schedule a visit.  Please continue daily skin protection including broad spectrum sunscreen SPF 30+ to sun-exposed areas, reapplying every 2 hours as needed when you're outdoors.   Staying in the shade or wearing long sleeves, sun  glasses (UVA+UVB protection) and wide brim hats (4-inch brim around the entire circumference of the hat) are also recommended for sun protection.    Due to recent changes in healthcare laws, you may see results of your pathology and/or laboratory studies on MyChart before the doctors have had a chance to review them. We understand that in some cases there may be results that are confusing or concerning to you. Please understand that not all results are received at the same time and often the doctors may need to interpret multiple results in order to provide you with the best plan of care or course of treatment. Therefore, we ask that you please give us 2 business days to thoroughly review all your results before contacting the office for clarification. Should we see a critical lab result, you will be contacted sooner.   If You Need Anything After Your Visit  If you have any questions or concerns for your doctor, please call our main line at [phone] and press option 4 to reach your doctor's medical assistant. If no one answers, please leave a voicemail as directed and we will return your call as soon as possible. Messages left after 4 pm will be answered the following business day.   You may also send us a message via MyChart. We typically respond to MyChart messages within 1-2 business days.  For prescription refills, please ask your pharmacy to contact our   office. Our fax number is [phone].  If you have an urgent issue when the clinic is closed that cannot wait until the next business day, you can page your doctor at the number below.    Please note that while we do our best to be available for urgent issues outside of office hours, we are not available 24/7.   If you have an urgent issue and are unable to reach us, you may choose to seek medical care at your doctor's office, retail clinic, urgent care center, or emergency room.  If you have a medical emergency, please immediately call  911 or go to the emergency department.  Pager Numbers  - Dr. Kowalski: [phone]  - Dr. Moye: [phone]  - Dr. Stewart: [phone]  In the event of inclement weather, please call our main line at [phone] for an update on the status of any delays or closures.  Dermatology Medication Tips: Please keep the boxes that topical medications come in in order to help keep track of the instructions about where and how to use these. Pharmacies typically print the medication instructions only on the boxes and not directly on the medication tubes.   If your medication is too expensive, please contact our office at [phone] option 4 or send us a message through MyChart.   We are unable to tell what your co-pay for medications will be in advance as this is different depending on your insurance coverage. However, we may be able to find a substitute medication at lower cost or fill out paperwork to get insurance to cover a needed medication.   If a prior authorization is required to get your medication covered by your insurance company, please allow us 1-2 business days to complete this process.  Drug prices often vary depending on where the prescription is filled and some pharmacies may offer cheaper prices.  The website www.goodrx.com contains coupons for medications through different pharmacies. The prices here do not account for what the cost may be with help from insurance (it may be cheaper with your insurance), but the website can give you the price if you did not use any insurance.  - You can print the associated coupon and take it with your prescription to the pharmacy.  - You may also stop by our office during regular business hours and pick up a GoodRx coupon card.  - If you need your prescription sent electronically to a different pharmacy, notify our office through [facility] MyChart or by phone at [phone] option 4.     Si Usted Necesita Algo Despus de Su  Visita  Tambin puede enviarnos un mensaje a travs de MyChart. Por lo general respondemos a los mensajes de MyChart en el transcurso de 1 a 2 das hbiles.  Para renovar recetas, por favor pida a su farmacia que se ponga en contacto con nuestra oficina. Nuestro nmero de fax es el [phone].  Si tiene un asunto urgente cuando la clnica est cerrada y que no puede esperar hasta el siguiente da hbil, puede llamar/localizar a su doctor(a) al nmero que aparece a continuacin.   Por favor, tenga en cuenta que aunque hacemos todo lo posible para estar disponibles para asuntos urgentes fuera del horario de oficina, no estamos disponibles las 24 horas del da, los 7 das de la semana.   Si tiene un problema urgente y no puede comunicarse con nosotros, puede optar por buscar atencin mdica  en el consultorio de su doctor(a), en una clnica privada, en   un centro de atencin urgente o en una sala de emergencias.  Si tiene una emergencia mdica, por favor llame inmediatamente al 911 o vaya a la sala de emergencias.  Nmeros de bper  - Dr. Kowalski: [phone]  - Dra. Moye: [phone]  - Dra. Stewart: [phone]  En caso de inclemencias del tiempo, por favor llame a nuestra lnea principal al [phone] para una actualizacin sobre el estado de cualquier retraso o cierre.  Consejos para la medicacin en dermatologa: Por favor, guarde las cajas en las que vienen los medicamentos de uso tpico para ayudarle a seguir las instrucciones sobre dnde y cmo usarlos. Las farmacias generalmente imprimen las instrucciones del medicamento slo en las cajas y no directamente en los tubos del medicamento.   Si su medicamento es muy caro, por favor, pngase en contacto con nuestra oficina llamando al [phone] y presione la opcin 4 o envenos un mensaje a travs de MyChart.   No podemos decirle cul ser su copago por los medicamentos por adelantado ya que esto es diferente dependiendo de la  cobertura de su seguro. Sin embargo, es posible que podamos encontrar un medicamento sustituto a menor costo o llenar un formulario para que el seguro cubra el medicamento que se considera necesario.   Si se requiere una autorizacin previa para que su compaa de seguros cubra su medicamento, por favor permtanos de 1 a 2 das hbiles para completar este proceso.  Los precios de los medicamentos varan con frecuencia dependiendo del lugar de dnde se surte la receta y alguna farmacias pueden ofrecer precios ms baratos.  El sitio web www.goodrx.com tiene cupones para medicamentos de diferentes farmacias. Los precios aqu no tienen en cuenta lo que podra costar con la ayuda del seguro (puede ser ms barato con su seguro), pero el sitio web puede darle el precio si no utiliz ningn seguro.  - Puede imprimir el cupn correspondiente y llevarlo con su receta a la farmacia.  - Tambin puede pasar por nuestra oficina durante el horario de atencin regular y recoger una tarjeta de cupones de GoodRx.  - Si necesita que su receta se enve electrnicamente a una farmacia diferente, informe a nuestra oficina a travs de MyChart de [place] o por telfono llamando al [phone] y presione la opcin 4.  

## 2022-09-17 DIAGNOSIS — S39011A Strain of muscle, fascia and tendon of abdomen, initial encounter: Secondary | ICD-10-CM | POA: Diagnosis not present

## 2022-10-28 ENCOUNTER — Telehealth: Payer: Self-pay | Admitting: Internal Medicine

## 2022-10-28 NOTE — Telephone Encounter (Signed)
Contacted Charise Carwin to schedule their annual wellness visit. Appointment made for 11/07/2022.  Verlee Rossetti; Care Guide Ambulatory Clinical Support [place] [facility] Direct Dial: [phone]

## 2022-11-07 ENCOUNTER — Ambulatory Visit (INDEPENDENT_AMBULATORY_CARE_PROVIDER_SITE_OTHER): Payer: Medicare PPO

## 2022-11-07 DIAGNOSIS — Z0000 Encounter for general adult medical examination without abnormal findings: Secondary | ICD-10-CM

## 2022-11-07 NOTE — Patient Instructions (Signed)

## 2022-11-07 NOTE — Progress Notes (Signed)
I connected with  Renee Vazquez on 11/07/22 by a audio enabled telemedicine application and verified that I am speaking with the correct person using two identifiers.  Patient Location: Home  Provider Location: Home Office  I discussed the limitations of evaluation and management by telemedicine. The patient expressed understanding and agreed to proceed.   Subjective:   Renee Vazquez is a 82 y.o. female who presents for Medicare Annual (Subsequent) preventive examination.  Review of Systems    Per HPI unless specifically indicated below.  Cardiac Risk Factors include: advanced age (>70men, >89 women);female gender          Objective:       09/12/2022   10:37 AM 09/12/2022   10:21 AM 08/05/2022    1:51 PM  Vitals with BMI  Height   5\' 2"   Weight   130 lbs 13 oz  BMI   23.92  Systolic 162 160 884  Diastolic 90 81 70  Pulse 78 78 80    There were no vitals filed for this visit. There is no height or weight on file to calculate BMI.     11/07/2022    1:46 PM 11/05/2021    9:11 AM 11/03/2020    9:10 AM 11/03/2019    9:08 AM 11/02/2018    9:23 AM 09/18/2017    9:27 AM 09/17/2016    9:18 AM  Advanced Directives  Does Patient Have a Medical Advance Directive? Yes Yes Yes Yes Yes Yes Yes  Type of Estate agent of [place];Living will Healthcare Power of [place];Living will Healthcare Power of [place];Living will Healthcare Power of Attorney Living will;Healthcare Power of Attorney Living will;Healthcare Power of Attorney Living will;Healthcare Power of Attorney  Does patient want to make changes to medical advance directive? No - Patient declined No - Patient declined No - Patient declined No - Patient declined No - Patient declined No - Patient declined No - Patient declined  Copy of Healthcare Power of Attorney in Chart? No - copy requested No - copy requested No - copy requested No - copy requested No - copy requested No - copy requested No  - copy requested    Current Medications (verified) Outpatient Encounter Medications as of 11/07/2022  Medication Sig   calcium citrate-vitamin D 200-200 MG-UNIT TABS Take 1 tablet by mouth daily.   cetirizine (ZYRTEC) 10 MG tablet Take 10 mg by mouth daily.   MELATONIN PO Take by mouth at bedtime as needed (sleep).   Multiple Vitamins-Minerals (CENTRUM SILVER ADULT 50+ PO) Take by mouth.   Multiple Vitamins-Minerals (PRESERVISION AREDS 2) CAPS Take 2 capsules by mouth daily.   No facility-administered encounter medications on file as of 11/07/2022.    Allergies (verified) No known drug allergy   History: Past Medical History:  Diagnosis Date   Breast cancer (HCC) 2007   Dr Jerelene Redden   Hypercholesterolemia    Osteopenia    Personal history of chemotherapy    Personal history of radiation therapy    Past Surgical History:  Procedure Laterality Date   BREAST LUMPECTOMY  2007   EYE SURGERY     detached retina   Family History  Problem Relation Age of Onset   Hypertension Mother    Hypercholesterolemia Mother    Prostate cancer Father    Breast cancer Maternal Aunt    Melanoma Sister    Vesicoureteral reflux Daughter    Colon cancer Neg Hx    Social History  Socioeconomic History   Marital status: Divorced    Spouse name: Not on file   Number of children: 4   Years of education: Not on file   Highest education level: Not on file  Occupational History   Occupation: retired  Tobacco Use   Smoking status: Never   Smokeless tobacco: Never  Vaping Use   Vaping Use: Never used  Substance and Sexual Activity   Alcohol use: Yes    Alcohol/week: 0.0 standard drinks of alcohol    Comment: occasional   Drug use: No   Sexual activity: Not Currently    Birth control/protection: Post-menopausal  Other Topics Concern   Not on file  Social History Narrative   Not on file   Social Determinants of Health   Financial Resource Strain: Low Risk  (11/07/2022)    Overall Financial Resource Strain (CARDIA)    Difficulty of Paying Living Expenses: Not hard at all  Food Insecurity: No Food Insecurity (11/07/2022)   Hunger Vital Sign    Worried About Running Out of Food in the Last Year: Never true    Ran Out of Food in the Last Year: Never true  Transportation Needs: No Transportation Needs (11/07/2022)   PRAPARE - Administrator, Civil Service (Medical): No    Lack of Transportation (Non-Medical): No  Physical Activity: Sufficiently Active (11/07/2022)   Exercise Vital Sign    Days of Exercise per Week: 4 days    Minutes of Exercise per Session: 60 min  Stress: No Stress Concern Present (11/07/2022)   [employer] of Occupational Health - Occupational Stress Questionnaire    Feeling of Stress : Not at all  Social Connections: Moderately Integrated (11/07/2022)   Social Connection and Isolation Panel [NHANES]    Frequency of Communication with Friends and Family: More than three times a week    Frequency of Social Gatherings with Friends and Family: More than three times a week    Attends Religious Services: More than 4 times per year    Active Member of [employer] or Organizations: Yes    Attends Banker Meetings: More than 4 times per year    Marital Status: Widowed    Tobacco Counseling Counseling given: Not Answered   Clinical Intake:  Pre-visit preparation completed: No  Pain : No/denies pain     Nutritional Status: BMI of 19-24  Normal Nutritional Risks: None Diabetes: No  How often do you need to have someone help you when you read instructions, pamphlets, or other written materials from your doctor or pharmacy?: 1 - Never  Diabetic? No  Interpreter Needed?: No  Information entered by :: Laurel Dimmer, CMA   Activities of Daily Living    11/06/2022    6:55 PM  In your present state of health, do you have any difficulty performing the following activities:  Hearing? 0  Vision? 0  Difficulty  concentrating or making decisions? 0  Walking or climbing stairs? 0  Dressing or bathing? 0  Doing errands, shopping? 0  Preparing Food and eating ? N  Using the Toilet? N  In the past six months, have you accidently leaked urine? N  Do you have problems with loss of bowel control? N  Managing your Medications? N  Managing your Finances? N  Housekeeping or managing your Housekeeping? N    Patient Care Team: Dale , MD as PCP - General (Internal Medicine)  Indicate any recent Medical Services you may have received from other than [facility]  providers in the past year (date may be approximate).     Assessment:   This is a routine wellness examination for [place].  Hearing/Vision screen Denies any hearing issues. Denies any vision changes. Annual Eye Exam. [facility]  Dietary issues and exercise activities discussed:     Goals Addressed   None    Depression Screen    11/07/2022    1:32 PM 02/01/2022    3:01 PM 11/05/2021    9:08 AM 11/03/2020    9:47 AM 09/04/2020   11:42 AM 11/03/2019    9:09 AM 11/02/2018    9:29 AM  PHQ 2/9 Scores  PHQ - 2 Score 0 0 0 0 0 0 0    Fall Risk    11/06/2022    6:55 PM 02/01/2022    3:01 PM 11/05/2021    9:08 AM 11/03/2020    9:47 AM 09/04/2020   11:42 AM  Fall Risk   Falls in the past year? 1 0 0 0 0  Number falls in past yr: 0 0 0 0   Injury with Fall? 0 0  0   Risk for fall due to :  No Fall Risks     Follow up  Falls evaluation completed Falls evaluation completed Falls evaluation completed     FALL RISK PREVENTION PERTAINING TO THE HOME:  Any stairs in or around the home? No  If so, are there any without handrails? No  Home free of loose throw rugs in walkways, pet beds, electrical cords, etc? Yes  Adequate lighting in your home to reduce risk of falls? Yes   ASSISTIVE DEVICES UTILIZED TO PREVENT FALLS:  Life alert? Yes  Use of a cane, walker or w/c? No  Grab bars in the bathroom? Yes  Shower chair or bench in  shower? Yes  Elevated toilet seat or a handicapped toilet? Yes   TIMED UP AND GO:  Was the test performed?Unable to perform, virtual appointment   Cognitive Function:    11/03/2019    9:15 AM 09/17/2016    9:26 AM 09/18/2015    9:49 AM  MMSE - Mini Mental State Exam  Not completed: Unable to complete    Orientation to time  5 5  Orientation to Place  5 5  Registration  3 3  Attention/ Calculation  5 5  Recall  3 3  Language- name 2 objects  2 2  Language- repeat  1 1  Language- follow 3 step command  3 3  Language- read & follow direction  1 1  Write a sentence  1 1  Copy design  1 1  Total score  30 30        11/07/2022    1:34 PM 11/02/2018    9:30 AM 09/18/2017    9:31 AM  6CIT Screen  What Year? 0 points 0 points 0 points  What month? 0 points 0 points 0 points  What time? 0 points 0 points 0 points  Count back from 20 0 points 0 points 0 points  Months in reverse 0 points 0 points 0 points  Repeat phrase 0 points 0 points 0 points  Total Score 0 points 0 points 0 points    Immunizations Immunization History  Administered Date(s) Administered   COVID-19, mRNA, vaccine(Comirnaty)12 years and older 04/29/2022   Influenza Split 04/15/2014   Influenza, High Dose Seasonal PF 04/11/2018   Influenza-Unspecified 04/06/2015, 04/22/2016, 03/24/2017, 03/29/2019, 03/24/2020, 03/24/2021, 03/24/2022   PFIZER Comirnaty(Gray Top)Covid-19  Tri-Sucrose Vaccine 11/07/2020   PFIZER(Purple Top)SARS-COV-2 Vaccination 06/30/2019, 07/21/2019, 02/14/2020   Pfizer Covid-19 Vaccine Bivalent Booster 7yrs & up 04/09/2021   Pneumococcal Conjugate-13 03/11/2014, 03/06/2018   Pneumococcal Polysaccharide-23 09/13/2015   Tdap 12/16/2014   Zoster Recombinat (Shingrix) 01/24/2021, 03/31/2021   Zoster, Live 08/17/2012    TDAP status: Up to date  Flu Vaccine status: Up to date  Pneumococcal vaccine status: Up to date  Covid-19 vaccine status: Information provided on how to obtain vaccines.    Qualifies for Shingles Vaccine? Yes   Zostavax completed Yes   Shingrix Completed?: Yes  Screening Tests Health Maintenance  Topic Date Due   COVID-19 Vaccine (7 - 2023-24 season) 06/24/2022   INFLUENZA VACCINE  01/23/2023   Medicare Annual Wellness (AWV)  11/07/2023   DTaP/Tdap/Td (2 - Td or Tdap) 12/15/2024   Pneumonia Vaccine 78+ Years old  Completed   DEXA SCAN  Completed   Zoster Vaccines- Shingrix  Completed   HPV VACCINES  Aged Out    Health Maintenance  Health Maintenance Due  Topic Date Due   COVID-19 Vaccine (7 - 2023-24 season) 06/24/2022    Colorectal cancer screening: No longer required.   Mammogram status: No longer required due to age.  DEXA Scan: 09/13/2021  Lung Cancer Screening: (Low Dose CT Chest recommended if Age 84-80 years, 30 pack-year currently smoking OR have quit w/in 15years.) does not qualify.   Lung Cancer Screening Referral: not applicable   Additional Screening:  Hepatitis C Screening: does not qualify  Vision Screening: Recommended annual ophthalmology exams for early detection of glaucoma and other disorders of the eye. Is the patient up to date with their annual eye exam?  Yes  Who is the provider or what is the name of the office in which the patient attends annual eye exams? [facility]  If pt is not established with a provider, would they like to be referred to a provider to establish care? No .   Dental Screening: Recommended annual dental exams for proper oral hygiene  Community Resource Referral / Chronic Care Management: CRR required this visit?  No   CCM required this visit?  No      Plan:     I have personally reviewed and noted the following in the patient's chart:   Medical and social history Use of alcohol, tobacco or illicit drugs  Current medications and supplements including opioid prescriptions. Patient is not currently taking opioid prescriptions. Functional ability and status Nutritional  status Physical activity Advanced directives List of other physicians Hospitalizations, surgeries, and ER visits in previous 12 months Vitals Screenings to include cognitive, depression, and falls Referrals and appointments  In addition, I have reviewed and discussed with patient certain preventive protocols, quality metrics, and best practice recommendations. A written personalized care plan for preventive services as well as general preventive health recommendations were provided to patient.   Ms. Kopman , Thank you for taking time to come for your Medicare Wellness Visit. I appreciate your ongoing commitment to your health goals. Please review the following plan we discussed and let me know if I can assist you in the future.   These are the goals we discussed:  Goals       Maintain healthy lifestyle (pt-stated)      Stay active Healthy diet        This is a list of the screening recommended for you and due dates:  Health Maintenance  Topic Date Due   COVID-19 Vaccine (7 -  2023-24 season) 06/24/2022   Flu Shot  01/23/2023   Medicare Annual Wellness Visit  11/07/2023   DTaP/Tdap/Td vaccine (2 - Td or Tdap) 12/15/2024   Pneumonia Vaccine  Completed   DEXA scan (bone density measurement)  Completed   Zoster (Shingles) Vaccine  Completed   HPV Vaccine  Aged [address], [place]   11/07/2022   Nurse Notes: Approximately 30 minute Non-Face -To-Face Medicare Wellness Visit

## 2023-01-02 ENCOUNTER — Other Ambulatory Visit: Payer: Self-pay | Admitting: Internal Medicine

## 2023-01-02 DIAGNOSIS — Z1231 Encounter for screening mammogram for malignant neoplasm of breast: Secondary | ICD-10-CM

## 2023-01-06 DIAGNOSIS — M7989 Other specified soft tissue disorders: Secondary | ICD-10-CM | POA: Diagnosis not present

## 2023-01-06 DIAGNOSIS — M25471 Effusion, right ankle: Secondary | ICD-10-CM | POA: Diagnosis not present

## 2023-01-15 DIAGNOSIS — M7989 Other specified soft tissue disorders: Secondary | ICD-10-CM | POA: Diagnosis not present

## 2023-01-15 DIAGNOSIS — M25471 Effusion, right ankle: Secondary | ICD-10-CM | POA: Diagnosis not present

## 2023-01-22 ENCOUNTER — Encounter (INDEPENDENT_AMBULATORY_CARE_PROVIDER_SITE_OTHER): Payer: Self-pay

## 2023-01-30 ENCOUNTER — Other Ambulatory Visit: Payer: Medicare PPO

## 2023-02-03 ENCOUNTER — Encounter: Payer: Medicare PPO | Admitting: Internal Medicine

## 2023-02-10 ENCOUNTER — Other Ambulatory Visit (INDEPENDENT_AMBULATORY_CARE_PROVIDER_SITE_OTHER): Payer: Medicare PPO

## 2023-02-10 DIAGNOSIS — Z0000 Encounter for general adult medical examination without abnormal findings: Secondary | ICD-10-CM

## 2023-02-10 LAB — LIPID PANEL
Cholesterol: 208 mg/dL — ABNORMAL HIGH (ref 0–200)
HDL: 63.6 mg/dL (ref >39.00)
LDL Cholesterol: 128 mg/dL — ABNORMAL HIGH (ref 0–99)
NonHDL: 144.28
Total CHOL/HDL Ratio: 3
Triglycerides: 83 mg/dL (ref 0.0–149.0)
VLDL: 16.6 mg/dL (ref 0.0–40.0)

## 2023-02-10 LAB — CBC WITH DIFFERENTIAL/PLATELET
Basophils Absolute: 0.1 10*3/uL (ref 0.0–0.1)
Basophils Relative: 1.1 % (ref 0.0–3.0)
Eosinophils Absolute: 0.2 10*3/uL (ref 0.0–0.7)
Eosinophils Relative: 3.1 % (ref 0.0–5.0)
HCT: 39.9 % (ref 36.0–46.0)
Hemoglobin: 13.1 g/dL (ref 12.0–15.0)
Lymphocytes Relative: 38.8 % (ref 12.0–46.0)
Lymphs Abs: 1.9 10*3/uL (ref 0.7–4.0)
MCHC: 32.8 g/dL (ref 30.0–36.0)
MCV: 94.3 fl (ref 78.0–100.0)
Monocytes Absolute: 0.6 10*3/uL (ref 0.1–1.0)
Monocytes Relative: 11.6 % (ref 3.0–12.0)
Neutro Abs: 2.3 10*3/uL (ref 1.4–7.7)
Neutrophils Relative %: 45.4 % (ref 43.0–77.0)
Platelets: 257 10*3/uL (ref 150.0–400.0)
RBC: 4.23 Mil/uL (ref 3.87–5.11)
RDW: 13.8 % (ref 11.5–15.5)
WBC: 5 10*3/uL (ref 4.0–10.5)

## 2023-02-10 LAB — COMPREHENSIVE METABOLIC PANEL
ALT: 10 U/L (ref 0–35)
AST: 17 U/L (ref 0–37)
Albumin: 3.7 g/dL (ref 3.5–5.2)
Alkaline Phosphatase: 58 U/L (ref 39–117)
BUN: 17 mg/dL (ref 6–23)
CO2: 26 mEq/L (ref 19–32)
Calcium: 9.3 mg/dL (ref 8.4–10.5)
Chloride: 105 mEq/L (ref 96–112)
Creatinine, Ser: 0.88 mg/dL (ref 0.40–1.20)
GFR: 61.65 mL/min (ref >60.00)
Glucose, Bld: 93 mg/dL (ref 70–99)
Potassium: 4.3 mEq/L (ref 3.5–5.1)
Sodium: 140 mEq/L (ref 135–145)
Total Bilirubin: 0.7 mg/dL (ref 0.2–1.2)
Total Protein: 7.5 g/dL (ref 6.0–8.3)

## 2023-02-10 LAB — HEPATIC FUNCTION PANEL
ALT: 10 U/L (ref 0–35)
AST: 17 U/L (ref 0–37)
Albumin: 3.7 g/dL (ref 3.5–5.2)
Alkaline Phosphatase: 58 U/L (ref 39–117)
Bilirubin, Direct: 0.1 mg/dL (ref 0.0–0.3)
Total Bilirubin: 0.7 mg/dL (ref 0.2–1.2)
Total Protein: 7.5 g/dL (ref 6.0–8.3)

## 2023-02-10 LAB — TSH: TSH: 3.81 u[IU]/mL (ref 0.35–5.50)

## 2023-02-11 ENCOUNTER — Encounter: Payer: Medicare PPO | Admitting: Internal Medicine

## 2023-02-20 ENCOUNTER — Encounter: Payer: Medicare PPO | Admitting: Internal Medicine

## 2023-02-27 ENCOUNTER — Other Ambulatory Visit: Payer: Self-pay

## 2023-02-27 ENCOUNTER — Encounter: Payer: Self-pay | Admitting: Emergency Medicine

## 2023-02-27 ENCOUNTER — Ambulatory Visit
Admission: EM | Admit: 2023-02-27 | Discharge: 2023-02-27 | Disposition: A | Discharge status: Home / Self Care | Payer: Medicare PPO | Attending: Emergency Medicine | Admitting: Emergency Medicine

## 2023-02-27 ENCOUNTER — Emergency Department
Admission: EM | Admit: 2023-02-27 | Discharge: 2023-02-28 | Disposition: A | Discharge status: Home / Self Care | Payer: Medicare PPO | Attending: Emergency Medicine | Admitting: Emergency Medicine

## 2023-02-27 ENCOUNTER — Emergency Department: Payer: Medicare PPO

## 2023-02-27 DIAGNOSIS — K828 Other specified diseases of gallbladder: Secondary | ICD-10-CM | POA: Diagnosis not present

## 2023-02-27 DIAGNOSIS — R1011 Right upper quadrant pain: Secondary | ICD-10-CM | POA: Diagnosis not present

## 2023-02-27 DIAGNOSIS — R079 Chest pain, unspecified: Secondary | ICD-10-CM | POA: Insufficient documentation

## 2023-02-27 DIAGNOSIS — R0789 Other chest pain: Secondary | ICD-10-CM | POA: Diagnosis not present

## 2023-02-27 DIAGNOSIS — K769 Liver disease, unspecified: Secondary | ICD-10-CM | POA: Diagnosis not present

## 2023-02-27 DIAGNOSIS — I700 Atherosclerosis of aorta: Secondary | ICD-10-CM | POA: Diagnosis not present

## 2023-02-27 DIAGNOSIS — K8050 Calculus of bile duct without cholangitis or cholecystitis without obstruction: Secondary | ICD-10-CM

## 2023-02-27 DIAGNOSIS — K8020 Calculus of gallbladder without cholecystitis without obstruction: Secondary | ICD-10-CM | POA: Insufficient documentation

## 2023-02-27 DIAGNOSIS — K8000 Calculus of gallbladder with acute cholecystitis without obstruction: Secondary | ICD-10-CM | POA: Diagnosis not present

## 2023-02-27 DIAGNOSIS — K449 Diaphragmatic hernia without obstruction or gangrene: Secondary | ICD-10-CM | POA: Diagnosis not present

## 2023-02-27 LAB — CBC
HCT: 41.6 % (ref 36.0–46.0)
Hemoglobin: 13.6 g/dL (ref 12.0–15.0)
MCH: 31.1 pg (ref 26.0–34.0)
MCHC: 32.7 g/dL (ref 30.0–36.0)
MCV: 95.2 fL (ref 80.0–100.0)
Platelets: 260 10*3/uL (ref 150–400)
RBC: 4.37 MIL/uL (ref 3.87–5.11)
RDW: 13.2 % (ref 11.5–15.5)
WBC: 10.7 10*3/uL — ABNORMAL HIGH (ref 4.0–10.5)
nRBC: 0 % (ref 0.0–0.2)

## 2023-02-27 LAB — BASIC METABOLIC PANEL
Anion gap: 11 (ref 5–15)
BUN: 19 mg/dL (ref 8–23)
CO2: 25 mmol/L (ref 22–32)
Calcium: 9.8 mg/dL (ref 8.9–10.3)
Chloride: 102 mmol/L (ref 98–111)
Creatinine, Ser: 0.74 mg/dL (ref 0.44–1.00)
GFR, Estimated: >60 mL/min (ref >60)
Glucose, Bld: 131 mg/dL — ABNORMAL HIGH (ref 70–99)
Potassium: 4.6 mmol/L (ref 3.5–5.1)
Sodium: 138 mmol/L (ref 135–145)

## 2023-02-27 LAB — TROPONIN I (HIGH SENSITIVITY): Troponin I (High Sensitivity): 4 ng/L (ref <18)

## 2023-02-27 MED ORDER — LIDOCAINE VISCOUS HCL 2 % MT SOLN
15.0000 mL | Freq: Once | OROMUCOSAL | Status: AC
  Administered 2023-02-27: 15 mL via OROMUCOSAL
  Filled 2023-02-27: qty 15

## 2023-02-27 MED ORDER — ALUM & MAG HYDROXIDE-SIMETH 200-200-20 MG/5ML PO SUSP
30.0000 mL | Freq: Once | ORAL | Status: AC
  Administered 2023-02-27: 30 mL via ORAL
  Filled 2023-02-27: qty 30

## 2023-02-27 NOTE — Discharge Instructions (Signed)
Please go to the nearest emergency department for immediate evaluation of your chest pain  On your EKG able to notate some changes to the rhythm and as you are actively having chest pain this could be the cause of why and therefore you will need additional workup and monitoring to determine cause and to rule out more serious causes

## 2023-02-27 NOTE — ED Notes (Signed)
Patient is being discharged from the Urgent Care and sent to the Emergency Department via POV. Per White, NP, patient is in need of higher level of care due to abnormal EKG Patient is aware and verbalizes understanding of plan of care.  Vitals:   02/27/23 1836  BP: (!) 149/72  Pulse: 69  Resp: 18  Temp: 97.7 F (36.5 C)  SpO2: 97%

## 2023-02-27 NOTE — ED Provider Notes (Signed)
Patient presents for evaluation of centralized chest pain beginning at the sternum wrapping to the bilateral sides of the body beginning approximately around 2 PM.  Symptoms after drinking aCoca-Cola and ice while still fizzed she typically does not drink.  Has been constant since.  Unsure if acid reflux.  Denies cardiac history.  Not attempted treatment of symptoms.  Denies abdominal bloating, heartburn indigestion, nausea, vomiting, visual disturbance, shortness of breath, dizziness, lightheadedness, headaches, weakness.  Vital signs are stable, blood pressure 149/72, no prior history of hypertension.  EKG in clinic shows T wave inversion in V1 and V2 and PACs able to hear to auscultation, discussed that there are changes with patient, no prior EKG to compare therefore she has been sent to the nearest emergency department for further evaluation and management, to be escorted by family   Valinda Hoar, NP 02/27/23 1851

## 2023-02-27 NOTE — ED Triage Notes (Signed)
Patient presents to [facility] for epigastric pain since 1430. She describes pain as a 7/10 squeezing pain.  No hx of HTN, cardiac, or GI problems. States she has treated her symptoms with maalox, pepto with no relief.   Denies N/V, SOB.

## 2023-02-27 NOTE — ED Provider Notes (Signed)
[facility] Provider Note    Event Date/Time   First MD Initiated Contact with Patient 02/27/23 2303     (approximate)   History   Chest Pain   HPI  Renee Vazquez is a 82 y.o. female who presents to the ED for evaluation of Chest Pain   Patient with a hiatal hernia presents with her daughter for evaluation of epigastric discomfort.  She is no history of intra-abdominal surgeries.  She reports epigastric/lower chest discomfort that started this afternoon around 2 PM within 30-40 minutes after eating and drinking.  Persistent throughout the day today.  No emesis, upper chest pain, dyspnea, fevers or cough, stool changes, urinary changes.   Reports some persistent, although waning discomfort now   Physical Exam   Triage Vital Signs: ED Triage Vitals  Encounter Vitals Group     BP 02/27/23 1945 (!) 152/63     Systolic BP Percentile --      Diastolic BP Percentile --      Pulse Rate 02/27/23 1945 69     Resp 02/27/23 1945 18     Temp 02/27/23 1945 97.9 F (36.6 C)     Temp Source 02/27/23 1945 Oral     SpO2 02/27/23 1945 99 %     Weight 02/27/23 1938 127 lb (57.6 kg)     Height 02/27/23 1938 5\' 2"  (1.575 m)     Head Circumference --      Peak Flow --      Pain Score 02/27/23 1937 5     Pain Loc --      Pain Education --      Exclude from Growth Chart --     Most recent vital signs: Vitals:   02/28/23 0000 02/28/23 0217  BP: 139/61 (!) 143/85  Pulse: 74 69  Resp: 14 14  Temp: 97.8 F (36.6 C) 98 F (36.7 C)  SpO2: 99% 99%    General: Awake, no distress.  CV:  Good peripheral perfusion.  Resp:  Normal effort.  Clear Abd:  No distention.  Epigastric and RUQ tenderness without peritoneal features.  Lower abdomen is benign. MSK:  No deformity noted.  Neuro:  No focal deficits appreciated. Other:     ED Results / Procedures / Treatments   Labs (all labs ordered are listed, but only abnormal results are displayed) Labs  Reviewed  BASIC METABOLIC PANEL - Abnormal; Notable for the following components:      Result Value   Glucose, Bld 131 (*)    All other components within normal limits  CBC - Abnormal; Notable for the following components:   WBC 10.7 (*)    All other components within normal limits  HEPATIC FUNCTION PANEL - Abnormal; Notable for the following components:   AST 47 (*)    All other components within normal limits  LIPASE, BLOOD  TROPONIN I (HIGH SENSITIVITY)  TROPONIN I (HIGH SENSITIVITY)    EKG Sinus rhythm with a rate of 71 bpm.  Normal axis and intervals.  Nonspecific ST changes inferior laterally without STEMI.  No comparison.  RADIOLOGY CXR interpreted by me without evidence of acute cardiopulmonary pathology. RUQ ultrasound interpreted by me with gallstones and borderline wall thickening without pericholecystic fluid  Official radiology report(s): US ABDOMEN LIMITED RUQ (LIVER/GB)  Result Date: 02/28/2023 CLINICAL DATA:  Right upper quadrant pain EXAM: ULTRASOUND ABDOMEN LIMITED RIGHT UPPER QUADRANT COMPARISON:  None Available. FINDINGS: Gallbladder: Cholelithiasis measuring 4.5 mm. The gallbladder wall is thickened measuring  6 mm. Negative sonographic Murphy's sign. No pericholecystic fluid. Common bile duct: Diameter: 4 mm.  No intrahepatic biliary dilation. Liver: Bilobed cystic lesion in the anterior right lobe measuring 1.3 x 1.2 x 1.5 cm without significant vascularity and favored to represent a benign cyst. Within normal limits in parenchymal echogenicity. Portal vein is patent on color Doppler imaging with normal direction of blood flow towards the liver. Other: None. IMPRESSION: Findings are equivocal for acute cholecystitis. There is cholelithiasis and gallbladder wall thickening without sonographic Murphy's sign or pericholecystic fluid. Electronically Signed   By: Minerva Fester M.D.   On: 02/28/2023 01:43   DG Chest 2 View  Result Date: 02/27/2023 CLINICAL DATA:  Chest  pain.  Mylanta Rolaids not providing relief. EXAM: CHEST - 2 VIEW COMPARISON:  Radiographs 07/23/2021 FINDINGS: Stable cardiomediastinal silhouette. Aortic atherosclerotic calcification. Hyperinflation. No focal consolidation, pleural effusion, or pneumothorax. No displaced rib fractures. Right axillary and chest wall surgical clips. Moderate hiatal hernia. IMPRESSION: No acute cardiopulmonary disease. Moderate hiatal hernia. Electronically Signed   By: Minerva Fester M.D.   On: 02/27/2023 20:59    PROCEDURES and INTERVENTIONS:  .1-3 Lead EKG Interpretation  Performed by: Delton Prairie, MD Authorized by: Delton Prairie, MD     Interpretation: normal     ECG rate:  70   ECG rate assessment: normal     Rhythm: sinus rhythm     Ectopy: none     Conduction: normal     Medications  alum & mag hydroxide-simeth (MAALOX/MYLANTA) 200-200-20 MG/5ML suspension 30 mL (30 mLs Oral Given 02/27/23 2341)  lidocaine (XYLOCAINE) 2 % viscous mouth solution 15 mL (15 mLs Mouth/Throat Given 02/27/23 2341)     IMPRESSION / MDM / ASSESSMENT AND PLAN / ED COURSE  I reviewed the triage vital signs and the nursing notes.  Differential diagnosis includes, but is not limited to, biliary colic, pancreatitis, ACS, pneumothorax  {Patient presents with symptoms of an acute illness or injury that is potentially life-threatening.  Patient presents with pain that I suspect is more abdominal and epigastric than chest.  EKG with nonspecific changes but no comparison and her symptoms sound more GI in etiology by their nature.  Her first troponin is negative and we will trend this for a second asked to watch her on the monitor.  A CBC and BMP were sent from triage that are reassuring.  We will add LFTs, lipase and obtain RUQ ultrasound.  We will provide empiric GI cocktail and reassess.  Clinical Course as of 02/28/23 0716  Fri Feb 28, 2023  0202 Reassessed.  Patient reports she feels well and has resolution of symptoms.  I  reexamined and she has no residual tenderness.  Discussed equivocal ultrasound and she reports that she would like to go ahead and go home and she can see a surgeon as an outpatient, I think this is reasonable considering how well she looks and her lack of persistent tenderness.  We discussed biliary colic, dietary changes, surgical evaluation as an outpatient as well as ED return precautions. [DS]    Clinical Course User Index [DS] Delton Prairie, MD     FINAL CLINICAL IMPRESSION(S) / ED DIAGNOSES   Final diagnoses:  Biliary colic  Calculus of gallbladder without cholecystitis without obstruction     Rx / DC Orders   ED Discharge Orders     None        Note:  This document was prepared using Dragon voice recognition software and may include unintentional  dictation errors.   Delton Prairie, MD 02/28/23 [phone]

## 2023-02-27 NOTE — ED Triage Notes (Signed)
Pt presents ambulatory to triage via POV with complaints of centralized CP that started 5 hours ago. Pt notes taking mylanta and rolaids without relief. Pt was seen at [facility] who obtained an EKG and advised she come the ED for more testing. Pt rates the pain 5/10 with some radiation to the L side of her chest. A&Ox4 at this time. Denies SOB.

## 2023-02-28 ENCOUNTER — Emergency Department: Payer: Medicare PPO

## 2023-02-28 DIAGNOSIS — K828 Other specified diseases of gallbladder: Secondary | ICD-10-CM | POA: Diagnosis not present

## 2023-02-28 DIAGNOSIS — K8000 Calculus of gallbladder with acute cholecystitis without obstruction: Secondary | ICD-10-CM | POA: Diagnosis not present

## 2023-02-28 DIAGNOSIS — R1011 Right upper quadrant pain: Secondary | ICD-10-CM | POA: Diagnosis not present

## 2023-02-28 DIAGNOSIS — K769 Liver disease, unspecified: Secondary | ICD-10-CM | POA: Diagnosis not present

## 2023-02-28 LAB — TROPONIN I (HIGH SENSITIVITY): Troponin I (High Sensitivity): 4 ng/L (ref <18)

## 2023-02-28 LAB — HEPATIC FUNCTION PANEL
ALT: 27 U/L (ref 0–44)
AST: 47 U/L — ABNORMAL HIGH (ref 15–41)
Albumin: 3.5 g/dL (ref 3.5–5.0)
Alkaline Phosphatase: 54 U/L (ref 38–126)
Bilirubin, Direct: 0.1 mg/dL (ref 0.0–0.2)
Indirect Bilirubin: 0.5 mg/dL (ref 0.3–0.9)
Total Bilirubin: 0.6 mg/dL (ref 0.3–1.2)
Total Protein: 7.6 g/dL (ref 6.5–8.1)

## 2023-02-28 LAB — LIPASE, BLOOD: Lipase: 41 U/L (ref 11–51)

## 2023-03-03 ENCOUNTER — Encounter: Payer: Self-pay | Admitting: Internal Medicine

## 2023-03-03 DIAGNOSIS — K8050 Calculus of bile duct without cholangitis or cholecystitis without obstruction: Secondary | ICD-10-CM

## 2023-03-03 NOTE — Telephone Encounter (Signed)
Order placed for GI referral (pt request referral to [facility])

## 2023-03-03 NOTE — Group Note (Deleted)

## 2023-03-03 NOTE — Telephone Encounter (Signed)
Ok to place referral to [facility]

## 2023-03-03 NOTE — Telephone Encounter (Signed)
Confirmed with patient that she would like to start with [facility] in [facility] and then proceed with surgery referral from them if needed/

## 2023-03-03 NOTE — Telephone Encounter (Signed)
I am ok with placing referral, but just want to clarify - the information given is for GI.  We can refer to GI, but the ER physician after w/up - recommended seeing a surgeon to see about gallbladder.  Please confirm Renee Vazquez is doing ok.

## 2023-03-07 ENCOUNTER — Telehealth: Payer: Self-pay | Admitting: Internal Medicine

## 2023-03-07 NOTE — Telephone Encounter (Signed)
Pt called wanting an update on [facility] gastro referral

## 2023-03-07 NOTE — Telephone Encounter (Signed)
Referral placed on 03/03/23

## 2023-03-13 DIAGNOSIS — H2513 Age-related nuclear cataract, bilateral: Secondary | ICD-10-CM | POA: Diagnosis not present

## 2023-03-13 DIAGNOSIS — Z0100 Encounter for examination of eyes and vision without abnormal findings: Secondary | ICD-10-CM | POA: Diagnosis not present

## 2023-03-13 DIAGNOSIS — M3501 Sicca syndrome with keratoconjunctivitis: Secondary | ICD-10-CM | POA: Diagnosis not present

## 2023-03-13 DIAGNOSIS — H353132 Nonexudative age-related macular degeneration, bilateral, intermediate dry stage: Secondary | ICD-10-CM | POA: Diagnosis not present

## 2023-03-13 NOTE — Telephone Encounter (Signed)
The patient just called and she said the referral can it be faxed to General surgery. The fax number is [phone]. Her number is [phone] if you have any questions.

## 2023-03-31 DIAGNOSIS — K8050 Calculus of bile duct without cholangitis or cholecystitis without obstruction: Secondary | ICD-10-CM | POA: Diagnosis not present

## 2023-04-02 ENCOUNTER — Encounter: Payer: Self-pay | Admitting: Internal Medicine

## 2023-04-02 DIAGNOSIS — R1011 Right upper quadrant pain: Secondary | ICD-10-CM | POA: Insufficient documentation

## 2023-06-05 ENCOUNTER — Encounter: Payer: Self-pay | Admitting: Internal Medicine

## 2023-06-05 ENCOUNTER — Ambulatory Visit (INDEPENDENT_AMBULATORY_CARE_PROVIDER_SITE_OTHER): Payer: Medicare PPO | Admitting: Internal Medicine

## 2023-06-05 VITALS — BP 128/84 | HR 78 | Temp 97.9°F | Ht 62.0 in | Wt 130.4 lb

## 2023-06-05 DIAGNOSIS — R319 Hematuria, unspecified: Secondary | ICD-10-CM

## 2023-06-05 DIAGNOSIS — E7800 Pure hypercholesterolemia, unspecified: Secondary | ICD-10-CM | POA: Diagnosis not present

## 2023-06-05 DIAGNOSIS — K8020 Calculus of gallbladder without cholecystitis without obstruction: Secondary | ICD-10-CM

## 2023-06-05 DIAGNOSIS — Z853 Personal history of malignant neoplasm of breast: Secondary | ICD-10-CM

## 2023-06-05 DIAGNOSIS — R9389 Abnormal findings on diagnostic imaging of other specified body structures: Secondary | ICD-10-CM

## 2023-06-05 DIAGNOSIS — Z0000 Encounter for general adult medical examination without abnormal findings: Secondary | ICD-10-CM | POA: Diagnosis not present

## 2023-06-05 NOTE — Progress Notes (Signed)
Subjective:    Patient ID: Renee Vazquez, female    DOB: April 05, 1941, 82 y.o.   MRN: [id number]  Patient here for  Chief Complaint  Patient presents with   Annual Exam    HPI Here for a physical exam.  She was recently evaluated (02/2023) in ER with right upper quadrant pain. She had a RUQ ultrasound done that showed gallstones and thickened gallbladder wall but no pericholecystic fluid. She felt better several hours later and was subsequently discharged home. Pain improved. Saw surgery. Elected watchful observation. She has had no further episodes of abdominal pain. Eating well.  No nausea or vomiting. No bowel change.  Stays active. Line dancing. Plan to join an aerobics class.  No chest pain or sob with increased activity or exertion. No further vaginal bleeding. Discussed previous mention of thickened endometrium. Desires no further w/up.    Past Medical History:  Diagnosis Date   Breast cancer (HCC) 2007   Dr Jerelene Redden   Hypercholesterolemia    Osteopenia    Personal history of chemotherapy    Personal history of radiation therapy    Past Surgical History:  Procedure Laterality Date   BREAST LUMPECTOMY  2007   EYE SURGERY     detached retina   Family History  Problem Relation Age of Onset   Hypertension Mother    Hypercholesterolemia Mother    Prostate cancer Father    Cancer Father    Cancer Sister    Breast cancer Maternal Aunt    Melanoma Sister    Vesicoureteral reflux Daughter    Colon cancer Neg Hx    Social History   Socioeconomic History   Marital status: Divorced    Spouse name: Not on file   Number of children: 4   Years of education: Not on file   Highest education level: Some college, no degree  Occupational History   Occupation: retired  Tobacco Use   Smoking status: Never   Smokeless tobacco: Never  Vaping Use   Vaping status: Never Used  Substance and Sexual Activity   Alcohol use: Not Currently    Comment: occasional    Drug use: No   Sexual activity: Not Currently    Birth control/protection: Post-menopausal  Other Topics Concern   Not on file  Social History Narrative   Not on file   Social Drivers of Health   Financial Resource Strain: Low Risk  (06/04/2023)   Overall Financial Resource Strain (CARDIA)    Difficulty of Paying Living Expenses: Not hard at all  Food Insecurity: No Food Insecurity (06/04/2023)   Hunger Vital Sign    Worried About Running Out of Food in the Last Year: Never true    Ran Out of Food in the Last Year: Never true  Transportation Needs: No Transportation Needs (06/04/2023)   PRAPARE - Administrator, Civil Service (Medical): No    Lack of Transportation (Non-Medical): No  Physical Activity: Sufficiently Active (06/04/2023)   Exercise Vital Sign    Days of Exercise per Week: 3 days    Minutes of Exercise per Session: 50 min  Stress: No Stress Concern Present (06/04/2023)   [employer] of Occupational Health - Occupational Stress Questionnaire    Feeling of Stress : Not at all  Social Connections: Moderately Integrated (06/04/2023)   Social Connection and Isolation Panel [NHANES]    Frequency of Communication with Friends and Family: More than three times a week    Frequency of  Social Gatherings with Friends and Family: More than three times a week    Attends Religious Services: More than 4 times per year    Active Member of [employer] or Organizations: Yes    Attends Engineer, structural: More than 4 times per year    Marital Status: Divorced     Review of Systems  Constitutional:  Negative for appetite change and unexpected weight change.  HENT:  Negative for congestion, sinus pressure and sore throat.   Eyes:  Negative for pain and visual disturbance.  Respiratory:  Negative for cough, chest tightness and shortness of breath.   Cardiovascular:  Negative for chest pain, palpitations and leg swelling.  Gastrointestinal:  Negative for  abdominal pain, diarrhea, nausea and vomiting.  Genitourinary:  Negative for difficulty urinating and dysuria.  Musculoskeletal:  Negative for joint swelling and myalgias.  Skin:  Negative for color change and rash.  Neurological:  Negative for dizziness and headaches.  Hematological:  Negative for adenopathy. Does not bruise/bleed easily.  Psychiatric/Behavioral:  Negative for agitation and dysphoric mood.        Objective:     BP 128/84   Pulse 78   Temp 97.9 F (36.6 C) (Oral)   Ht 5\' 2"  (1.575 m)   Wt 130 lb 6.4 oz (59.1 kg)   SpO2 100%   BMI 23.85 kg/m  Wt Readings from Last 3 Encounters:  06/05/23 130 lb 6.4 oz (59.1 kg)  02/27/23 127 lb (57.6 kg)  08/05/22 130 lb 12.8 oz (59.3 kg)    Physical Exam Vitals reviewed.  Constitutional:      General: She is not in acute distress.    Appearance: Normal appearance.  HENT:     Head: Normocephalic and atraumatic.     Right Ear: External ear normal.     Left Ear: External ear normal.     Mouth/Throat:     Pharynx: No oropharyngeal exudate or posterior oropharyngeal erythema.  Eyes:     General: No scleral icterus.       Right eye: No discharge.        Left eye: No discharge.     Conjunctiva/sclera: Conjunctivae normal.  Neck:     Thyroid: No thyromegaly.  Cardiovascular:     Rate and Rhythm: Normal rate and regular rhythm.  Pulmonary:     Effort: No respiratory distress.     Breath sounds: Normal breath sounds. No wheezing.  Abdominal:     General: Bowel sounds are normal.     Palpations: Abdomen is soft.     Tenderness: There is no abdominal tenderness.  Musculoskeletal:        General: No swelling or tenderness.     Cervical back: Neck supple. No tenderness.  Lymphadenopathy:     Cervical: No cervical adenopathy.  Skin:    Findings: No erythema or rash.  Neurological:     Mental Status: She is alert.  Psychiatric:        Mood and Affect: Mood normal.        Behavior: Behavior normal.       Outpatient Encounter Medications as of 06/05/2023  Medication Sig   calcium citrate-vitamin D 200-200 MG-UNIT TABS Take 1 tablet by mouth daily.   cetirizine (ZYRTEC) 10 MG tablet Take 10 mg by mouth daily.   MELATONIN PO Take by mouth at bedtime as needed (sleep).   Multiple Vitamins-Minerals (CENTRUM SILVER ADULT 50+ PO) Take by mouth.   Multiple Vitamins-Minerals (PRESERVISION AREDS 2) CAPS Take  2 capsules by mouth daily.   No facility-administered encounter medications on file as of 06/05/2023.     Lab Results  Component Value Date   WBC 10.7 (H) 02/27/2023   HGB 13.6 02/27/2023   HCT 41.6 02/27/2023   PLT 260 02/27/2023   GLUCOSE 131 (H) 02/27/2023   CHOL 208 (H) 02/10/2023   TRIG 83.0 02/10/2023   HDL 63.60 02/10/2023   LDLDIRECT 152.4 08/16/2013   LDLCALC 128 (H) 02/10/2023   ALT 27 02/27/2023   AST 47 (H) 02/27/2023   NA 138 02/27/2023   K 4.6 02/27/2023   CL 102 02/27/2023   CREATININE 0.74 02/27/2023   BUN 19 02/27/2023   CO2 25 02/27/2023   TSH 3.81 02/10/2023    US ABDOMEN LIMITED RUQ (LIVER/GB) Result Date: 02/28/2023 CLINICAL DATA:  Right upper quadrant pain EXAM: ULTRASOUND ABDOMEN LIMITED RIGHT UPPER QUADRANT COMPARISON:  None Available. FINDINGS: Gallbladder: Cholelithiasis measuring 4.5 mm. The gallbladder wall is thickened measuring 6 mm. Negative sonographic Murphy's sign. No pericholecystic fluid. Common bile duct: Diameter: 4 mm.  No intrahepatic biliary dilation. Liver: Bilobed cystic lesion in the anterior right lobe measuring 1.3 x 1.2 x 1.5 cm without significant vascularity and favored to represent a benign cyst. Within normal limits in parenchymal echogenicity. Portal vein is patent on color Doppler imaging with normal direction of blood flow towards the liver. Other: None. IMPRESSION: Findings are equivocal for acute cholecystitis. There is cholelithiasis and gallbladder wall thickening without sonographic Murphy's sign or pericholecystic  fluid. Electronically Signed   By: Minerva Fester M.D.   On: 02/28/2023 01:43   DG Chest 2 View Result Date: 02/27/2023 CLINICAL DATA:  Chest pain.  Mylanta Rolaids not providing relief. EXAM: CHEST - 2 VIEW COMPARISON:  Radiographs 07/23/2021 FINDINGS: Stable cardiomediastinal silhouette. Aortic atherosclerotic calcification. Hyperinflation. No focal consolidation, pleural effusion, or pneumothorax. No displaced rib fractures. Right axillary and chest wall surgical clips. Moderate hiatal hernia. IMPRESSION: No acute cardiopulmonary disease. Moderate hiatal hernia. Electronically Signed   By: Minerva Fester M.D.   On: 02/27/2023 20:59       Assessment & Plan:  Routine general medical examination at a health care facility  Hypercholesterolemia Assessment & Plan: Have discussed recommendation for cholesterol medication.  She declines.  Wants to continue diet and exercise. Follow lipid panel.   Orders: -     CBC with Differential/Platelet; Future -     Basic metabolic panel; Future -     Hepatic function panel; Future -     Lipid panel; Future  Health care maintenance Assessment & Plan: Physical today 06/05/23.  Mammogram 07/03/22 - [facility]).  Colonoscopy 08/2013.  Recommended f/u in 10 years.  Has declined hemoccult cards. States was told by GI did not need further colonoscopy.    Thickened endometrium Assessment & Plan: Previously had ultrasound that revealed thickened endometrium.  S/p endometrial biopsy - insufficient tissue for diagnosis.  S/p hysteroscopy and D&C.  Reports no bleeding.  Desires no further w/up or evaluation.    Gallstones Assessment & Plan: She was recently evaluated (02/2023) in ER with right upper quadrant pain. She had a RUQ ultrasound done that showed gallstones and thickened gallbladder wall but no pericholecystic fluid. She felt better several hours later and was subsequently discharged home. Pain improved. Saw surgery. Elected watchful observation. She  has had no further episodes of abdominal pain. Eating well.  No nausea or vomiting.   History of breast cancer Assessment & Plan: Mammogram 07/03/22 - [facility].    Hematuria, unspecified type Assessment & Plan: Urine 08/21/22 - negative blood/negative rbc's.       Dale Parrott, MD

## 2023-06-05 NOTE — Assessment & Plan Note (Addendum)
Physical today 06/05/23.  Mammogram 07/03/22 - [facility]).  Colonoscopy 08/2013.  Recommended f/u in 10 years.  Has declined hemoccult cards. States was told by GI did not need further colonoscopy.

## 2023-06-08 ENCOUNTER — Encounter: Payer: Self-pay | Admitting: Internal Medicine

## 2023-06-08 DIAGNOSIS — K8020 Calculus of gallbladder without cholecystitis without obstruction: Secondary | ICD-10-CM | POA: Insufficient documentation

## 2023-06-08 NOTE — Assessment & Plan Note (Signed)
She was recently evaluated (02/2023) in ER with right upper quadrant pain. She had a RUQ ultrasound done that showed gallstones and thickened gallbladder wall but no pericholecystic fluid. She felt better several hours later and was subsequently discharged home. Pain improved. Saw surgery. Elected watchful observation. She has had no further episodes of abdominal pain. Eating well.  No nausea or vomiting.

## 2023-06-08 NOTE — Assessment & Plan Note (Signed)
Have discussed recommendation for cholesterol medication.  She declines.  Wants to continue diet and exercise. Follow lipid panel.

## 2023-06-08 NOTE — Assessment & Plan Note (Signed)
Urine 08/21/22 - negative blood/negative rbc's.

## 2023-06-08 NOTE — Assessment & Plan Note (Signed)
Mammogram 07/03/22 - Birads I.

## 2023-06-08 NOTE — Assessment & Plan Note (Signed)
Previously had ultrasound that revealed thickened endometrium.  S/p endometrial biopsy - insufficient tissue for diagnosis.  S/p hysteroscopy and D&C.  Reports no bleeding.  Desires no further w/up or evaluation.  

## 2023-06-15 ENCOUNTER — Encounter: Payer: Self-pay | Admitting: Internal Medicine

## 2023-06-16 ENCOUNTER — Telehealth: Payer: Self-pay

## 2023-06-16 NOTE — Telephone Encounter (Signed)
See message below °

## 2023-06-16 NOTE — Telephone Encounter (Signed)
Ok to take as new pts.

## 2023-06-16 NOTE — Telephone Encounter (Signed)
Called patient on both numbers. Unable to leave message.

## 2023-06-16 NOTE — Telephone Encounter (Signed)
When she returns call, please let her know that Dr Lorin Picket is out of office until after the new year but message has been sent to her.

## 2023-06-16 NOTE — Telephone Encounter (Signed)
Copied from CRM [id number]. Topic: General - Other >> Jun 16, 2023  8:20 AM Elizebeth Brooking wrote: Reason for CRM: Patient called wanting to make sure that Dr.Scott received her message on mychart she is asking for a reply from Dr.Scott or Nurse

## 2023-06-16 NOTE — Telephone Encounter (Signed)
Called patient unable to leave message

## 2023-06-17 ENCOUNTER — Telehealth: Payer: Self-pay | Admitting: Internal Medicine

## 2023-06-17 NOTE — Telephone Encounter (Signed)
MyChart Message:  Dr Lorin Picket  My son and his wife are patients of [facility] however their doctor is leaving the practice.  I was wondering if you could accept them as patients and keep them in [place]   Their names are Renee Vazquez and Renee Vazquez Thank you for considering this request. Adelfa Koh

## 2023-06-17 NOTE — Telephone Encounter (Signed)
See other message

## 2023-07-04 DIAGNOSIS — H2513 Age-related nuclear cataract, bilateral: Secondary | ICD-10-CM | POA: Diagnosis not present

## 2023-07-04 DIAGNOSIS — H353131 Nonexudative age-related macular degeneration, bilateral, early dry stage: Secondary | ICD-10-CM | POA: Diagnosis not present

## 2023-07-04 DIAGNOSIS — M3501 Sicca syndrome with keratoconjunctivitis: Secondary | ICD-10-CM | POA: Diagnosis not present

## 2023-07-08 ENCOUNTER — Ambulatory Visit
Admission: RE | Admit: 2023-07-08 | Discharge: 2023-07-08 | Disposition: A | Discharge status: Home / Self Care | Payer: Medicare PPO | Source: Ambulatory Visit | Attending: Internal Medicine | Admitting: Internal Medicine

## 2023-07-08 DIAGNOSIS — Z1231 Encounter for screening mammogram for malignant neoplasm of breast: Secondary | ICD-10-CM | POA: Insufficient documentation

## 2023-07-16 DIAGNOSIS — H2511 Age-related nuclear cataract, right eye: Secondary | ICD-10-CM | POA: Diagnosis not present

## 2023-07-17 ENCOUNTER — Other Ambulatory Visit: Payer: Self-pay

## 2023-07-17 ENCOUNTER — Encounter: Payer: Self-pay | Admitting: Ophthalmology

## 2023-07-28 ENCOUNTER — Telehealth: Payer: Self-pay

## 2023-07-28 NOTE — Telephone Encounter (Signed)
 Noted

## 2023-07-28 NOTE — Telephone Encounter (Signed)
Copied from CRM [id number]. Topic: General - Other >> Jul 28, 2023  9:41 AM Orinda Kenner C wrote: Reason for CRM: Patient [phone] needs to speak with the nurse regarding a message she sent via MyChart and has not heard back.  Patient's son and his wife are patients of [facility] however their doctor is leaving the practice.  Wondering if you could accept them as patients and keep them in [place], their names are Renee Vazquez and Renee Vazquez. Please advise and call back.  I spoke with patient and let her know that Dr. Dale Florence has agreed to accept Renee Vazquez and Renee Vazquez as patients.  Patient gave me birth date for Renee Vazquez.  I left a message for both patients asking them to please call us to schedule their transfer of care appointments with Dr. Dale [place].

## 2023-07-31 NOTE — Discharge Instructions (Signed)

## 2023-08-05 ENCOUNTER — Ambulatory Visit: Payer: Medicare PPO | Admitting: Anesthesiology

## 2023-08-05 ENCOUNTER — Encounter: Payer: Self-pay | Admitting: Ophthalmology

## 2023-08-05 ENCOUNTER — Other Ambulatory Visit: Payer: Self-pay

## 2023-08-05 ENCOUNTER — Encounter
Admission: RE | Disposition: A | Discharge status: Home / Self Care | Payer: Self-pay | Source: Home / Self Care | Attending: Ophthalmology

## 2023-08-05 ENCOUNTER — Ambulatory Visit
Admission: RE | Admit: 2023-08-05 | Discharge: 2023-08-05 | Disposition: A | Discharge status: Home / Self Care | Payer: Medicare PPO | Attending: Ophthalmology | Admitting: Ophthalmology

## 2023-08-05 DIAGNOSIS — H2511 Age-related nuclear cataract, right eye: Secondary | ICD-10-CM | POA: Diagnosis not present

## 2023-08-05 DIAGNOSIS — E7800 Pure hypercholesterolemia, unspecified: Secondary | ICD-10-CM | POA: Diagnosis not present

## 2023-08-05 DIAGNOSIS — H2512 Age-related nuclear cataract, left eye: Secondary | ICD-10-CM | POA: Diagnosis not present

## 2023-08-05 DIAGNOSIS — H269 Unspecified cataract: Secondary | ICD-10-CM | POA: Insufficient documentation

## 2023-08-05 HISTORY — PX: CATARACT EXTRACTION W/PHACO: SHX586

## 2023-08-05 SURGERY — PHACOEMULSIFICATION, CATARACT, WITH IOL INSERTION
Anesthesia: Monitor Anesthesia Care | Laterality: Right

## 2023-08-05 MED ORDER — FENTANYL CITRATE (PF) 100 MCG/2ML IJ SOLN
INTRAMUSCULAR | Status: DC | PRN
  Administered 2023-08-05: 50 ug via INTRAVENOUS

## 2023-08-05 MED ORDER — MIDAZOLAM HCL 2 MG/2ML IJ SOLN
INTRAMUSCULAR | Status: AC
  Filled 2023-08-05: qty 2

## 2023-08-05 MED ORDER — SIGHTPATH DOSE#1 BSS IO SOLN
INTRAOCULAR | Status: DC | PRN
  Administered 2023-08-05: 2 mL

## 2023-08-05 MED ORDER — SIGHTPATH DOSE#1 BSS IO SOLN
INTRAOCULAR | Status: DC | PRN
  Administered 2023-08-05: 53 mL via OPHTHALMIC

## 2023-08-05 MED ORDER — BRIMONIDINE TARTRATE-TIMOLOL 0.2-0.5 % OP SOLN
OPHTHALMIC | Status: DC | PRN
  Administered 2023-08-05: 1 [drp] via OPHTHALMIC

## 2023-08-05 MED ORDER — SIGHTPATH DOSE#1 BSS IO SOLN
INTRAOCULAR | Status: DC | PRN
  Administered 2023-08-05: 15 mL via INTRAOCULAR

## 2023-08-05 MED ORDER — TETRACAINE HCL 0.5 % OP SOLN
1.0000 [drp] | OPHTHALMIC | Status: DC | PRN
  Administered 2023-08-05 (×3): 1 [drp] via OPHTHALMIC

## 2023-08-05 MED ORDER — MIDAZOLAM HCL 2 MG/2ML IJ SOLN
INTRAMUSCULAR | Status: DC | PRN
  Administered 2023-08-05: 1 mg via INTRAVENOUS

## 2023-08-05 MED ORDER — MOXIFLOXACIN HCL 0.5 % OP SOLN
OPHTHALMIC | Status: DC | PRN
  Administered 2023-08-05: 1 [drp] via OPHTHALMIC

## 2023-08-05 MED ORDER — ARMC OPHTHALMIC DILATING DROPS
1.0000 | OPHTHALMIC | Status: DC | PRN
  Administered 2023-08-05 (×3): 1 via OPHTHALMIC

## 2023-08-05 MED ORDER — ARMC OPHTHALMIC DILATING DROPS
OPHTHALMIC | Status: AC
  Filled 2023-08-05: qty 0.5

## 2023-08-05 MED ORDER — SIGHTPATH DOSE#1 NA CHONDROIT SULF-NA HYALURON 40-17 MG/ML IO SOLN
INTRAOCULAR | Status: DC | PRN
  Administered 2023-08-05: 1 mL via INTRAOCULAR

## 2023-08-05 MED ORDER — FENTANYL CITRATE (PF) 100 MCG/2ML IJ SOLN
INTRAMUSCULAR | Status: AC
  Filled 2023-08-05: qty 2

## 2023-08-05 MED ORDER — TETRACAINE HCL 0.5 % OP SOLN
OPHTHALMIC | Status: AC
  Filled 2023-08-05: qty 4

## 2023-08-05 SURGICAL SUPPLY — 10 items
CATARACT SUITE SIGHTPATH (MISCELLANEOUS) ×1
CYSTOTOME ANG REV CUT SHRT 25G (CUTTER) ×1
CYSTOTOME ANGL RVRS SHRT 25G (CUTTER) ×1 IMPLANT
FEE CATARACT SUITE SIGHTPATH (MISCELLANEOUS) ×1 IMPLANT
GLOVE BIOGEL PI IND STRL 8 (GLOVE) ×1 IMPLANT
GLOVE SURG LX STRL 8.0 MICRO (GLOVE) ×1 IMPLANT
LENS IOL TECNIS EYHANCE 22.0 (Intraocular Lens) IMPLANT
NDL FILTER BLUNT 18X1 1/2 (NEEDLE) ×1 IMPLANT
NEEDLE FILTER BLUNT 18X1 1/2 (NEEDLE) ×1
SYR 3ML LL SCALE MARK (SYRINGE) ×1 IMPLANT

## 2023-08-05 NOTE — Transfer of Care (Signed)
Immediate Anesthesia Transfer of Care Note  Patient: Renee Vazquez  Procedure(s) Performed: CATARACT EXTRACTION PHACO AND INTRAOCULAR LENS PLACEMENT (IOC) RIGHT 10.27 00:59.0 (Right)  Patient Location: PACU  Anesthesia Type: MAC  Level of Consciousness: awake, alert  and patient cooperative  Airway and Oxygen Therapy: Patient Spontanous Breathing and Patient connected to supplemental oxygen  Post-op Assessment: Post-op Vital signs reviewed, Patient's Cardiovascular Status Stable, Respiratory Function Stable, Patent Airway and No signs of Nausea or vomiting  Post-op Vital Signs: Reviewed and stable  Complications: No notable events documented.

## 2023-08-05 NOTE — H&P (Signed)
[facility]   Primary Care Physician:  Dale Unicoi, MD Ophthalmologist: Dr. Druscilla Brownie  Pre-Procedure History & Physical: HPI:  Renee Vazquez is a 83 y.o. female here for cataract surgery.   Past Medical History:  Diagnosis Date   Breast cancer [facility]) 2007   Dr Jerelene Redden   Hypercholesterolemia    Osteopenia    Personal history of chemotherapy    Personal history of radiation therapy     Past Surgical History:  Procedure Laterality Date   BREAST LUMPECTOMY  2007   EYE SURGERY     detached retina    Prior to Admission medications   Medication Sig Start Date End Date Taking? Authorizing Provider  calcium citrate-vitamin D 200-200 MG-UNIT TABS Take 1 tablet by mouth daily.   Yes [provider]  cetirizine (ZYRTEC) 10 MG tablet Take 10 mg by mouth daily.   Yes [provider]  MELATONIN PO Take by mouth at bedtime as needed (sleep).   Yes [provider]  Multiple Vitamins-Minerals (CENTRUM SILVER ADULT 50+ PO) Take by mouth.   Yes [provider]  Multiple Vitamins-Minerals (PRESERVISION AREDS 2) CAPS Take 2 capsules by mouth daily.   Yes [provider]    Allergies as of 07/08/2023 - Review Complete 06/08/2023  Allergen Reaction Noted   No known drug allergy  05/01/2012    Family History  Problem Relation Age of Onset   Hypertension Mother    Hypercholesterolemia Mother    Prostate cancer Father    Cancer Father    Cancer Sister    Melanoma Sister    Vesicoureteral reflux Daughter    Colon cancer Neg Hx    Breast cancer Neg Hx     Social History   Socioeconomic History   Marital status: Divorced    Spouse name: Not on file   Number of children: 4   Years of education: Not on file   Highest education level: Some college, no degree  Occupational History   Occupation: retired  Tobacco Use   Smoking status: Never   Smokeless tobacco: Never  Vaping Use   Vaping status: Never Used   Substance and Sexual Activity   Alcohol use: Not Currently    Comment: occasional   Drug use: No   Sexual activity: Not Currently    Birth control/protection: Post-menopausal  Other Topics Concern   Not on file  Social History Narrative   Not on file   Social Drivers of Health   Financial Resource Strain: Low Risk  (06/04/2023)   Overall Financial Resource Strain (CARDIA)    Difficulty of Paying Living Expenses: Not hard at all  Food Insecurity: No Food Insecurity (06/04/2023)   Hunger Vital Sign    Worried About Running Out of Food in the Last Year: Never true    Ran Out of Food in the Last Year: Never true  Transportation Needs: No Transportation Needs (06/04/2023)   PRAPARE - Administrator, Civil Service (Medical): No    Lack of Transportation (Non-Medical): No  Physical Activity: Sufficiently Active (06/04/2023)   Exercise Vital Sign    Days of Exercise per Week: 3 days    Minutes of Exercise per Session: 50 min  Stress: No Stress Concern Present (06/04/2023)   [employer] of Occupational Health - Occupational Stress Questionnaire    Feeling of Stress : Not at all  Social Connections: Moderately Integrated (06/04/2023)   Social Connection and Isolation Panel [NHANES]  Frequency of Communication with Friends and Family: More than three times a week    Frequency of Social Gatherings with Friends and Family: More than three times a week    Attends Religious Services: More than 4 times per year    Active Member of [employer] or Organizations: Yes    Attends Engineer, structural: More than 4 times per year    Marital Status: Divorced  Intimate Partner Violence: Not At Risk (11/07/2022)   Humiliation, Afraid, Rape, and Kick questionnaire    Fear of Current or Ex-Partner: No    Emotionally Abused: No    Physically Abused: No    Sexually Abused: No    Review of Systems: See HPI, otherwise negative ROS  Physical Exam: BP (!) 164/56   Pulse 81    Temp (!) 97.5 F (36.4 C) (Temporal)   Resp 15   Ht 5\' 2"  (1.575 m)   Wt 59.2 kg   SpO2 98%   BMI 23.89 kg/m  General:   Alert, cooperative in NAD Head:  Normocephalic and atraumatic. Respiratory:  Normal work of breathing. Cardiovascular:  RRR  Impression/Plan: Renee Vazquez is here for cataract surgery.  Risks, benefits, limitations, and alternatives regarding cataract surgery have been reviewed with the patient.  Questions have been answered.  All parties agreeable.   Galen Manila, MD  08/05/2023, 9:24 AM

## 2023-08-05 NOTE — Anesthesia Postprocedure Evaluation (Signed)
Anesthesia Post Note  Patient: Renee Vazquez  Procedure(s) Performed: CATARACT EXTRACTION PHACO AND INTRAOCULAR LENS PLACEMENT (IOC) RIGHT 10.27 00:59.0 (Right)  Patient location during evaluation: PACU Anesthesia Type: MAC Level of consciousness: awake and alert Pain management: pain level controlled Vital Signs Assessment: post-procedure vital signs reviewed and stable Respiratory status: spontaneous breathing, nonlabored ventilation, respiratory function stable and patient connected to nasal cannula oxygen Cardiovascular status: stable and blood pressure returned to baseline Postop Assessment: no apparent nausea or vomiting Anesthetic complications: no   No notable events documented.   Last Vitals:  Vitals:   08/05/23 0948 08/05/23 0952  BP: (!) 145/62 136/64  Pulse: 73 71  Resp: 14 16  Temp: (!) 36.3 C (!) 36.3 C  SpO2: 99% 99%    Last Pain:  Vitals:   08/05/23 0952  TempSrc:   PainSc: 0-No pain                 Marisue Humble

## 2023-08-05 NOTE — Op Note (Signed)
PREOPERATIVE DIAGNOSIS:  Nuclear sclerotic cataract of the right eye.   POSTOPERATIVE DIAGNOSIS:  Cataract   OPERATIVE PROCEDURE:ORPROCALL@   SURGEON:  Galen Manila, MD.   ANESTHESIA:  Anesthesiologist: Marisue Humble, MD CRNA: Barbette Hair, CRNA  1.      Managed anesthesia care. 2.      0.54ml of Shugarcaine was instilled in the eye following the paracentesis.   COMPLICATIONS:  None.   TECHNIQUE:   Stop and chop   DESCRIPTION OF PROCEDURE:  The patient was examined and consented in the preoperative holding area where the aforementioned topical anesthesia was applied to the right eye and then brought back to the Operating Room where the right eye was prepped and draped in the usual sterile ophthalmic fashion and a lid speculum was placed. A paracentesis was created with the side port blade and the anterior chamber was filled with viscoelastic. A near clear corneal incision was performed with the steel keratome. A continuous curvilinear capsulorrhexis was performed with a cystotome followed by the capsulorrhexis forceps. Hydrodissection and hydrodelineation were carried out with BSS on a blunt cannula. The lens was removed in a stop and chop  technique and the remaining cortical material was removed with the irrigation-aspiration handpiece. The capsular bag was inflated with viscoelastic and the Technis [id number]  lens was placed in the capsular bag without complication. The remaining viscoelastic was removed from the eye with the irrigation-aspiration handpiece. The wounds were hydrated. The anterior chamber was flushed with BSS and the eye was inflated to physiologic pressure. 0.72ml of Vigamox was placed in the anterior chamber. The wounds were found to be water tight. The eye was dressed with Combigan. The patient was given protective glasses to wear throughout the day and a shield with which to sleep tonight. The patient was also given drops with which to begin a drop regimen today and will  follow-up with me in one day. Implant Name Type Inv. Item Serial No. Manufacturer Lot No. LRB No. Used Action  LENS IOL TECNIS EYHANCE 22.0 - [id number] Intraocular Lens LENS IOL TECNIS EYHANCE 22.0 [id number] [employer]  Right 1 Implanted   Procedure(s): CATARACT EXTRACTION PHACO AND INTRAOCULAR LENS PLACEMENT (IOC) RIGHT 10.27 00:59.0 (Right)  Electronically signed: Galen Manila 08/05/2023 9:48 AM

## 2023-08-05 NOTE — Anesthesia Preprocedure Evaluation (Signed)
Anesthesia Evaluation  Patient identified by MRN, date of birth, ID band Patient awake    Reviewed: Allergy & Precautions, H&P , NPO status , Patient's Chart, lab work & pertinent test results  Airway Mallampati: III  TM Distance: <3 FB Neck ROM: Full   Comment: Very short TMD, 1 FB Dental no notable dental hx.    Pulmonary neg pulmonary ROS   Pulmonary exam normal breath sounds clear to auscultation       Cardiovascular negative cardio ROS Normal cardiovascular exam Rhythm:Regular Rate:Normal     Neuro/Psych negative neurological ROS  negative psych ROS   GI/Hepatic negative GI ROS, Neg liver ROS,,,  Endo/Other  negative endocrine ROS    Renal/GU Renal diseasenegative Renal ROS  negative genitourinary   Musculoskeletal negative musculoskeletal ROS (+)    Abdominal   Peds negative pediatric ROS (+)  Hematology negative hematology ROS (+) Blood dyscrasia, anemia   Anesthesia Other Findings Medical HHypercholesterolemia Breast cancer (HCC) Osteopenia  Personal history of chemotherapy Personal history of radiation therapy Previously had surgery for retina detachment. This surgery was under MAC and patient states she "remembered it all" and she is "fine with that"    Reproductive/Obstetrics negative OB ROS                              Anesthesia Physical Anesthesia Plan  ASA: 2  Anesthesia Plan: MAC   Post-op Pain Management:    Induction: Intravenous  PONV Risk Score and Plan:   Airway Management Planned: Natural Airway and Nasal Cannula  Additional Equipment:   Intra-op Plan:   Post-operative Plan:   Informed Consent: I have reviewed the patients History and Physical, chart, labs and discussed the procedure including the risks, benefits and alternatives for the proposed anesthesia with the patient or authorized representative who has indicated his/her understanding and  acceptance.     Dental Advisory Given  Plan Discussed with: Anesthesiologist, CRNA and Surgeon  Anesthesia Plan Comments: (Patient consented for risks of anesthesia including but not limited to:  - adverse reactions to medications - damage to eyes, teeth, lips or other oral mucosa - nerve damage due to positioning  - sore throat or hoarseness - Damage to heart, brain, nerves, lungs, other parts of body or loss of life  Patient voiced understanding and assent.)         Anesthesia Quick Evaluation

## 2023-08-06 ENCOUNTER — Encounter: Payer: Self-pay | Admitting: Ophthalmology

## 2023-08-06 ENCOUNTER — Other Ambulatory Visit: Payer: Self-pay

## 2023-08-06 DIAGNOSIS — H2512 Age-related nuclear cataract, left eye: Secondary | ICD-10-CM | POA: Diagnosis not present

## 2023-08-06 NOTE — Anesthesia Preprocedure Evaluation (Addendum)
 Anesthesia Evaluation  Patient identified by MRN, date of birth, ID band Patient awake    Reviewed: Allergy & Precautions, H&P , NPO status , Patient's Chart, lab work & pertinent test results  Airway Mallampati: III  TM Distance: >3 FB Neck ROM: Full    Dental no notable dental hx.    Pulmonary neg pulmonary ROS   Pulmonary exam normal breath sounds clear to auscultation       Cardiovascular negative cardio ROS Normal cardiovascular exam Rhythm:Regular Rate:Normal     Neuro/Psych negative neurological ROS  negative psych ROS   GI/Hepatic negative GI ROS, Neg liver ROS,,,  Endo/Other  negative endocrine ROS    Renal/GU Renal diseasenegative Renal ROS  negative genitourinary   Musculoskeletal negative musculoskeletal ROS (+)    Abdominal   Peds negative pediatric ROS (+)  Hematology negative hematology ROS (+) Blood dyscrasia, anemia   Anesthesia Other Findings   Hypercholesterolemia  Breast cancer (HCC) Osteopenia  Personal history of chemotherapy Personal history of radiation therapy   Previous cataract surgery 08-05-23      Reproductive/Obstetrics negative OB ROS                             Anesthesia Physical Anesthesia Plan  ASA: 2  Anesthesia Plan: MAC   Post-op Pain Management:    Induction: Intravenous  PONV Risk Score and Plan:   Airway Management Planned: Natural Airway and Nasal Cannula  Additional Equipment:   Intra-op Plan:   Post-operative Plan:   Informed Consent: I have reviewed the patients History and Physical, chart, labs and discussed the procedure including the risks, benefits and alternatives for the proposed anesthesia with the patient or authorized representative who has indicated his/her understanding and acceptance.     Dental Advisory Given  Plan Discussed with: Anesthesiologist, CRNA and Surgeon  Anesthesia Plan Comments: (Patient  consented for risks of anesthesia including but not limited to:  - adverse reactions to medications - damage to eyes, teeth, lips or other oral mucosa - nerve damage due to positioning  - sore throat or hoarseness - Damage to heart, brain, nerves, lungs, other parts of body or loss of life  Patient voiced understanding and assent.)        Anesthesia Quick Evaluation

## 2023-08-19 ENCOUNTER — Ambulatory Visit: Payer: Medicare PPO | Admitting: Anesthesiology

## 2023-08-19 ENCOUNTER — Ambulatory Visit
Admission: RE | Admit: 2023-08-19 | Discharge: 2023-08-19 | Disposition: A | Discharge status: Home / Self Care | Payer: Medicare PPO | Attending: Ophthalmology | Admitting: Ophthalmology

## 2023-08-19 ENCOUNTER — Encounter
Admission: RE | Disposition: A | Discharge status: Home / Self Care | Payer: Self-pay | Source: Home / Self Care | Attending: Ophthalmology

## 2023-08-19 ENCOUNTER — Other Ambulatory Visit: Payer: Self-pay

## 2023-08-19 ENCOUNTER — Encounter: Payer: Self-pay | Admitting: Ophthalmology

## 2023-08-19 DIAGNOSIS — H2512 Age-related nuclear cataract, left eye: Secondary | ICD-10-CM | POA: Insufficient documentation

## 2023-08-19 DIAGNOSIS — D649 Anemia, unspecified: Secondary | ICD-10-CM | POA: Diagnosis not present

## 2023-08-19 DIAGNOSIS — H269 Unspecified cataract: Secondary | ICD-10-CM | POA: Diagnosis not present

## 2023-08-19 DIAGNOSIS — H2511 Age-related nuclear cataract, right eye: Secondary | ICD-10-CM | POA: Diagnosis not present

## 2023-08-19 HISTORY — PX: CATARACT EXTRACTION W/PHACO: SHX586

## 2023-08-19 SURGERY — PHACOEMULSIFICATION, CATARACT, WITH IOL INSERTION
Anesthesia: Monitor Anesthesia Care | Site: Eye | Laterality: Left

## 2023-08-19 MED ORDER — TETRACAINE HCL 0.5 % OP SOLN
OPHTHALMIC | Status: AC
  Filled 2023-08-19: qty 4

## 2023-08-19 MED ORDER — MIDAZOLAM HCL 2 MG/2ML IJ SOLN
INTRAMUSCULAR | Status: AC
  Filled 2023-08-19: qty 2

## 2023-08-19 MED ORDER — SIGHTPATH DOSE#1 BSS IO SOLN
INTRAOCULAR | Status: DC | PRN
  Administered 2023-08-19: 2 mL

## 2023-08-19 MED ORDER — MOXIFLOXACIN HCL 0.5 % OP SOLN
OPHTHALMIC | Status: DC | PRN
Start: 2023-08-19 — End: 2023-08-19
  Administered 2023-08-19: .2 mL via OPHTHALMIC

## 2023-08-19 MED ORDER — BRIMONIDINE TARTRATE-TIMOLOL 0.2-0.5 % OP SOLN
OPHTHALMIC | Status: DC | PRN
  Administered 2023-08-19: 1 [drp] via OPHTHALMIC

## 2023-08-19 MED ORDER — ARMC OPHTHALMIC DILATING DROPS
OPHTHALMIC | Status: AC
  Filled 2023-08-19: qty 0.5

## 2023-08-19 MED ORDER — FENTANYL CITRATE (PF) 100 MCG/2ML IJ SOLN
INTRAMUSCULAR | Status: DC | PRN
  Administered 2023-08-19: 50 ug via INTRAVENOUS

## 2023-08-19 MED ORDER — TETRACAINE HCL 0.5 % OP SOLN
1.0000 [drp] | OPHTHALMIC | Status: DC | PRN
  Administered 2023-08-19 (×3): 1 [drp] via OPHTHALMIC

## 2023-08-19 MED ORDER — SIGHTPATH DOSE#1 NA CHONDROIT SULF-NA HYALURON 40-17 MG/ML IO SOLN
INTRAOCULAR | Status: DC | PRN
  Administered 2023-08-19: 1 mL via INTRAOCULAR

## 2023-08-19 MED ORDER — SIGHTPATH DOSE#1 BSS IO SOLN
INTRAOCULAR | Status: DC | PRN
  Administered 2023-08-19: 15 mL via INTRAOCULAR

## 2023-08-19 MED ORDER — SIGHTPATH DOSE#1 BSS IO SOLN
INTRAOCULAR | Status: DC | PRN
  Administered 2023-08-19: 57 mL via OPHTHALMIC

## 2023-08-19 MED ORDER — MIDAZOLAM HCL 2 MG/2ML IJ SOLN
INTRAMUSCULAR | Status: DC | PRN
  Administered 2023-08-19: 1 mg via INTRAVENOUS

## 2023-08-19 MED ORDER — FENTANYL CITRATE (PF) 100 MCG/2ML IJ SOLN
INTRAMUSCULAR | Status: AC
  Filled 2023-08-19: qty 2

## 2023-08-19 MED ORDER — ARMC OPHTHALMIC DILATING DROPS
1.0000 | OPHTHALMIC | Status: DC | PRN
  Administered 2023-08-19 (×3): 1 via OPHTHALMIC

## 2023-08-19 SURGICAL SUPPLY — 14 items
CANNULA ANT/CHMB 27G (MISCELLANEOUS) IMPLANT
CANNULA ANT/CHMB 27GA (MISCELLANEOUS) IMPLANT
CATARACT SUITE SIGHTPATH (MISCELLANEOUS) ×1 IMPLANT
CYSTOTOME ANG REV CUT SHRT 25G (CUTTER) ×1 IMPLANT
CYSTOTOME ANGL RVRS SHRT 25G (CUTTER) ×1 IMPLANT
FEE CATARACT SUITE SIGHTPATH (MISCELLANEOUS) ×1 IMPLANT
GLOVE BIOGEL PI IND STRL 8 (GLOVE) ×1 IMPLANT
GLOVE SURG LX STRL 8.0 MICRO (GLOVE) ×1 IMPLANT
GLOVE SURG PROTEXIS BL SZ6.5 (GLOVE) ×1 IMPLANT
GLOVE SURG SYN 6.5 PF PI BL (GLOVE) ×1 IMPLANT
LENS IOL TECNIS EYHANCE 21.0 (Intraocular Lens) IMPLANT
NDL FILTER BLUNT 18X1 1/2 (NEEDLE) ×1 IMPLANT
NEEDLE FILTER BLUNT 18X1 1/2 (NEEDLE) ×1 IMPLANT
SYR 3ML LL SCALE MARK (SYRINGE) ×1 IMPLANT

## 2023-08-19 NOTE — H&P (Signed)
  [facility]   Primary Care Physician:  Dale [place], MD Ophthalmologist: Dr. Maren Reamer  Pre-Procedure History & Physical: HPI:  ARBOR Vazquez is a 83 y.o. female here for cataract surgery.   Past Medical History:  Diagnosis Date   Breast cancer [facility]) 2007   Dr Jerelene Redden   Hypercholesterolemia    Osteopenia    Personal history of chemotherapy    Personal history of radiation therapy     Past Surgical History:  Procedure Laterality Date   BREAST LUMPECTOMY  2007   CATARACT EXTRACTION W/PHACO Right 08/05/2023   Procedure: CATARACT EXTRACTION PHACO AND INTRAOCULAR LENS PLACEMENT (IOC) RIGHT 10.27 00:59.0;  Surgeon: Galen Manila, MD;  Location: [facility];  Service: Ophthalmology;  Laterality: Right;   EYE SURGERY     detached retina    Prior to Admission medications   Medication Sig Start Date End Date Taking? Authorizing Provider  calcium citrate-vitamin D 200-200 MG-UNIT TABS Take 1 tablet by mouth daily.   Yes [provider]  cetirizine (ZYRTEC) 10 MG tablet Take 10 mg by mouth daily.   Yes [provider]  MELATONIN PO Take by mouth at bedtime as needed (sleep).   Yes [provider]  Multiple Vitamins-Minerals (CENTRUM SILVER ADULT 50+ PO) Take by mouth.   Yes [provider]  Multiple Vitamins-Minerals (PRESERVISION AREDS 2) CAPS Take 2 capsules by mouth daily.   Yes [provider]    Allergies as of 07/08/2023 - Review Complete 06/08/2023  Allergen Reaction Noted   No known drug allergy  05/01/2012    Family History  Problem Relation Age of Onset   Hypertension Mother    Hypercholesterolemia Mother    Prostate cancer Father    Cancer Father    Cancer Sister    Melanoma Sister    Vesicoureteral reflux Daughter    Colon cancer Neg Hx    Breast cancer Neg Hx     Social History   Socioeconomic History   Marital status: Divorced    Spouse name: Not on file   Number of  children: 4   Years of education: Not on file   Highest education level: Some college, no degree  Occupational History   Occupation: retired  Tobacco Use   Smoking status: Never   Smokeless tobacco: Never  Vaping Use   Vaping status: Never Used  Substance and Sexual Activity   Alcohol use: Not Currently    Comment: occasional   Drug use: No   Sexual activity: Not Currently    Birth control/protection: Post-menopausal  Other Topics Concern   Not on file  Social History Narrative   Not on file   Social Drivers of Health   Financial Resource Strain: Low Risk  (06/04/2023)   Overall Financial Resource Strain (CARDIA)    Difficulty of Paying Living Expenses: Not hard at all  Food Insecurity: No Food Insecurity (06/04/2023)   Hunger Vital Sign    Worried About Running Out of Food in the Last Year: Never true    Ran Out of Food in the Last Year: Never true  Transportation Needs: No Transportation Needs (06/04/2023)   PRAPARE - Administrator, Civil Service (Medical): No    Lack of Transportation (Non-Medical): No  Physical Activity: Sufficiently Active (06/04/2023)   Exercise Vital Sign    Days of Exercise per Week: 3 days    Minutes of Exercise per Session: 50 min  Stress: No Stress Concern Present (06/04/2023)  [employer] of Occupational Health - Occupational Stress Questionnaire    Feeling of Stress : Not at all  Social Connections: Moderately Integrated (06/04/2023)   Social Connection and Isolation Panel [NHANES]    Frequency of Communication with Friends and Family: More than three times a week    Frequency of Social Gatherings with Friends and Family: More than three times a week    Attends Religious Services: More than 4 times per year    Active Member of [employer] or Organizations: Yes    Attends Engineer, structural: More than 4 times per year    Marital Status: Divorced  Intimate Partner Violence: Not At Risk (11/07/2022)   Humiliation,  Afraid, Rape, and Kick questionnaire    Fear of Current or Ex-Partner: No    Emotionally Abused: No    Physically Abused: No    Sexually Abused: No    Review of Systems: See HPI, otherwise negative ROS  Physical Exam: BP (!) 161/85   Pulse 72   Temp (!) 97.5 F (36.4 C)   Resp 14   Wt 60.3 kg   SpO2 99%   BMI 24.33 kg/m  General:   Alert, cooperative in NAD Head:  Normocephalic and atraumatic. Respiratory:  Normal work of breathing. Cardiovascular:  RRR  Impression/Plan: Renee Vazquez is here for cataract surgery.  Risks, benefits, limitations, and alternatives regarding cataract surgery have been reviewed with the patient.  Questions have been answered.  All parties agreeable.   Galen Manila, MD  08/19/2023, 12:17 PM

## 2023-08-19 NOTE — Op Note (Signed)
 PREOPERATIVE DIAGNOSIS:  Nuclear sclerotic cataract of the left eye.   POSTOPERATIVE DIAGNOSIS:  Nuclear sclerotic cataract of the left eye.   OPERATIVE PROCEDURE:ORPROCALL@   SURGEON:  Renee Manila, MD.   ANESTHESIA:  Anesthesiologist: Marisue Humble, MD CRNA: Barbette Hair, CRNA  1.      Managed anesthesia care. 2.     0.39ml of Shugarcaine was instilled following the paracentesis   COMPLICATIONS:  None.   TECHNIQUE:   Stop and chop   DESCRIPTION OF PROCEDURE:  The patient was examined and consented in the preoperative holding area where the aforementioned topical anesthesia was applied to the left eye and then brought back to the Operating Room where the left eye was prepped and draped in the usual sterile ophthalmic fashion and a lid speculum was placed. A paracentesis was created with the side port blade and the anterior chamber was filled with viscoelastic. A near clear corneal incision was performed with the steel keratome. A continuous curvilinear capsulorrhexis was performed with a cystotome followed by the capsulorrhexis forceps. Hydrodissection and hydrodelineation were carried out with BSS on a blunt cannula. The lens was removed in a stop and chop  technique and the remaining cortical material was removed with the irrigation-aspiration handpiece. The capsular bag was inflated with viscoelastic and the Technis [id number] lens was placed in the capsular bag without complication. The remaining viscoelastic was removed from the eye with the irrigation-aspiration handpiece. The wounds were hydrated. The anterior chamber was flushed with BSS and the eye was inflated to physiologic pressure. 0.36ml Vigamox was placed in the anterior chamber. The wounds were found to be water tight. The eye was dressed with Combigan. The patient was given protective glasses to wear throughout the day and a shield with which to sleep tonight. The patient was also given drops with which to begin a drop regimen  today and will follow-up with me in one day. Implant Name Type Inv. Item Serial No. Manufacturer Lot No. LRB No. Used Action  LENS IOL TECNIS EYHANCE 21.0 - [id number] Intraocular Lens LENS IOL TECNIS EYHANCE 21.0 [id number] [employer]  Left 1 Implanted    Procedure(s): CATARACT EXTRACTION PHACO AND INTRAOCULAR LENS PLACEMENT (IOC) LEFT 9.39 00:45.1 (Left)  Electronically signed: Galen Vazquez 08/19/2023 12:40 PM

## 2023-08-19 NOTE — Discharge Instructions (Signed)

## 2023-08-19 NOTE — Transfer of Care (Signed)
 Immediate Anesthesia Transfer of Care Note  Patient: Renee Vazquez  Procedure(s) Performed: CATARACT EXTRACTION PHACO AND INTRAOCULAR LENS PLACEMENT (IOC) LEFT 9.39 00:45.1 (Left: Eye)  Patient Location: PACU  Anesthesia Type: MAC  Level of Consciousness: awake, alert  and patient cooperative  Airway and Oxygen Therapy: Patient Spontanous Breathing and Patient connected to supplemental oxygen  Post-op Assessment: Post-op Vital signs reviewed, Patient's Cardiovascular Status Stable, Respiratory Function Stable, Patent Airway and No signs of Nausea or vomiting  Post-op Vital Signs: Reviewed and stable  Complications: No notable events documented.

## 2023-08-19 NOTE — Anesthesia Postprocedure Evaluation (Signed)
 Anesthesia Post Note  Patient: Renee Vazquez  Procedure(s) Performed: CATARACT EXTRACTION PHACO AND INTRAOCULAR LENS PLACEMENT (IOC) LEFT 9.39 00:45.1 (Left: Eye)  Patient location during evaluation: PACU Anesthesia Type: MAC Level of consciousness: awake and alert Pain management: pain level controlled Vital Signs Assessment: post-procedure vital signs reviewed and stable Respiratory status: spontaneous breathing, nonlabored ventilation, respiratory function stable and patient connected to nasal cannula oxygen Cardiovascular status: stable and blood pressure returned to baseline Postop Assessment: no apparent nausea or vomiting Anesthetic complications: no   No notable events documented.   Last Vitals:  Vitals:   08/19/23 1239 08/19/23 1242  BP: (!) 131/58 125/64  Pulse: 67 65  Resp: 14 16  Temp: (!) 36.4 C (!) 36.4 C  SpO2: 100% 100%    Last Pain:  Vitals:   08/19/23 1242  PainSc: 0-No pain                 Ludwika Rodd C Neil Errickson

## 2023-08-21 ENCOUNTER — Encounter: Payer: Self-pay | Admitting: Ophthalmology

## 2023-09-08 ENCOUNTER — Telehealth: Payer: Self-pay | Admitting: Internal Medicine

## 2023-09-08 NOTE — Telephone Encounter (Signed)
 Copied from CRM [id number]. Topic: Medicare AWV >> Sep 08, 2023 11:39 AM Payton Doughty wrote: Reason for CRM: Called LVM 09/08/2023 to schedule AWV. Please schedule office or virtual visits.  Verlee Rossetti; Care Guide Ambulatory Clinical Support  l [facility] Direct Dial: [phone]

## 2023-09-16 ENCOUNTER — Encounter: Payer: Self-pay | Admitting: Dermatology

## 2023-09-16 ENCOUNTER — Ambulatory Visit (INDEPENDENT_AMBULATORY_CARE_PROVIDER_SITE_OTHER): Payer: Medicare PPO | Admitting: Dermatology

## 2023-09-16 DIAGNOSIS — W908XXA Exposure to other nonionizing radiation, initial encounter: Secondary | ICD-10-CM

## 2023-09-16 DIAGNOSIS — D3611 Benign neoplasm of peripheral nerves and autonomic nervous system of face, head, and neck: Secondary | ICD-10-CM

## 2023-09-16 DIAGNOSIS — L578 Other skin changes due to chronic exposure to nonionizing radiation: Secondary | ICD-10-CM | POA: Diagnosis not present

## 2023-09-16 DIAGNOSIS — Z1283 Encounter for screening for malignant neoplasm of skin: Secondary | ICD-10-CM

## 2023-09-16 DIAGNOSIS — L820 Inflamed seborrheic keratosis: Secondary | ICD-10-CM | POA: Diagnosis not present

## 2023-09-16 DIAGNOSIS — D229 Melanocytic nevi, unspecified: Secondary | ICD-10-CM

## 2023-09-16 DIAGNOSIS — L821 Other seborrheic keratosis: Secondary | ICD-10-CM | POA: Diagnosis not present

## 2023-09-16 DIAGNOSIS — D492 Neoplasm of unspecified behavior of bone, soft tissue, and skin: Secondary | ICD-10-CM | POA: Diagnosis not present

## 2023-09-16 DIAGNOSIS — L814 Other melanin hyperpigmentation: Secondary | ICD-10-CM

## 2023-09-16 DIAGNOSIS — Z872 Personal history of diseases of the skin and subcutaneous tissue: Secondary | ICD-10-CM

## 2023-09-16 DIAGNOSIS — D1801 Hemangioma of skin and subcutaneous tissue: Secondary | ICD-10-CM | POA: Diagnosis not present

## 2023-09-16 DIAGNOSIS — L299 Pruritus, unspecified: Secondary | ICD-10-CM

## 2023-09-16 DIAGNOSIS — L853 Xerosis cutis: Secondary | ICD-10-CM

## 2023-09-16 NOTE — Patient Instructions (Addendum)
 Recommend lotion applicator for back. Available on [employer].          Cryotherapy Aftercare  Wash gently with soap and water everyday.   Apply Vaseline Jelly daily until healed.     Wound Care Instructions  Cleanse wound gently with soap and water once a day then pat dry with clean gauze. Apply a thin coat of Petrolatum (petroleum jelly, "Vaseline") over the wound (unless you have an allergy to this). We recommend that you use a new, sterile tube of Vaseline. Do not pick or remove scabs. Do not remove the yellow or white "healing tissue" from the base of the wound.  Cover the wound with fresh, clean, nonstick gauze and secure with paper tape. You may use Band-Aids in place of gauze and tape if the wound is small enough, but would recommend trimming much of the tape off as there is often too much. Sometimes Band-Aids can irritate the skin.  You should call the office for your biopsy report after 1 week if you have not already been contacted.  If you experience any problems, such as abnormal amounts of bleeding, swelling, significant bruising, significant pain, or evidence of infection, please call the office immediately.  FOR ADULT SURGERY PATIENTS: If you need something for pain relief you may take 1 extra strength Tylenol (acetaminophen) AND 2 Ibuprofen (200mg  each) together every 4 hours as needed for pain. (do not take these if you are allergic to them or if you have a reason you should not take them.) Typically, you may only need pain medication for 1 to 3 days.        Recommend daily broad spectrum sunscreen SPF 30+ to sun-exposed areas, reapply every 2 hours as needed. Call for new or changing lesions.  Staying in the shade or wearing long sleeves, sun glasses (UVA+UVB protection) and wide brim hats (4-inch brim around the entire circumference of the hat) are also recommended for sun protection.      Gentle Skin Care Guide  1. Bathe no more than once a day.  2.  Avoid bathing in hot water  3. Use a mild soap like Dove, Vanicream, Cetaphil, CeraVe. Can use Lever 2000 or Cetaphil antibacterial soap  4. Use soap only where you need it. On most days, use it under your arms, between your legs, and on your feet. Let the water rinse other areas unless visibly dirty.  5. When you get out of the bath/shower, use a towel to gently blot your skin dry, don't rub it.  6. While your skin is still a little damp, apply a moisturizing cream such as Vanicream, CeraVe, Cetaphil, Eucerin, Sarna lotion or plain Vaseline Jelly. For hands apply Neutrogena [place] Hand Cream or Excipial Hand Cream.  7. Reapply moisturizer any time you start to itch or feel dry.  8. Sometimes using free and clear laundry detergents can be helpful. Fabric softener sheets should be avoided. Downy Free & Gentle liquid, or any liquid fabric softener that is free of dyes and perfumes, it acceptable to use  9. If your doctor has given you prescription creams you may apply moisturizers over them          Seborrheic Keratosis  What causes seborrheic keratoses? Seborrheic keratoses are harmless, common skin growths that first appear during adult life.  As time goes by, more growths appear.  Some people may develop a large number of them.  Seborrheic keratoses appear on both covered and uncovered body parts.  They are not caused by sunlight.  The tendency to develop seborrheic keratoses can be inherited.  They vary in color from skin-colored to gray, brown, or even black.  They can be either smooth or have a rough, warty surface.   Seborrheic keratoses are superficial and look as if they were stuck on the skin.  Under the microscope this type of keratosis looks like layers upon layers of skin.  That is why at times the top layer may seem to fall off, but the rest of the growth remains and re-grows.    Treatment Seborrheic keratoses do not need to be treated, but can easily be removed in the  office.  Seborrheic keratoses often cause symptoms when they rub on clothing or jewelry.  Lesions can be in the way of shaving.  If they become inflamed, they can cause itching, soreness, or burning.  Removal of a seborrheic keratosis can be accomplished by freezing, burning, or surgery. If any spot bleeds, scabs, or grows rapidly, please return to have it checked, as these can be an indication of a skin cancer.     Melanoma ABCDEs  Melanoma is the most dangerous type of skin cancer, and is the leading cause of death from skin disease.  You are more likely to develop melanoma if you: Have light-colored skin, light-colored eyes, or red or blond hair Spend a lot of time in the sun Tan regularly, either outdoors or in a tanning bed Have had blistering sunburns, especially during childhood Have a close family member who has had a melanoma Have atypical moles or large birthmarks  Early detection of melanoma is key since treatment is typically straightforward and cure rates are extremely high if we catch it early.   The first sign of melanoma is often a change in a mole or a new dark spot.  The ABCDE system is a way of remembering the signs of melanoma.  A for asymmetry:  The two halves do not match. B for border:  The edges of the growth are irregular. C for color:  A mixture of colors are present instead of an even brown color. D for diameter:  Melanomas are usually (but not always) greater than 6mm - the size of a pencil eraser. E for evolution:  The spot keeps changing in size, shape, and color.  Please check your skin once per month between visits. You can use a small mirror in front and a large mirror behind you to keep an eye on the back side or your body.   If you see any new or changing lesions before your next follow-up, please call to schedule a visit.  Please continue daily skin protection including broad spectrum sunscreen SPF 30+ to sun-exposed areas, reapplying every 2 hours as  needed when you're outdoors.   Staying in the shade or wearing long sleeves, sun glasses (UVA+UVB protection) and wide brim hats (4-inch brim around the entire circumference of the hat) are also recommended for sun protection.       Due to recent changes in healthcare laws, you may see results of your pathology and/or laboratory studies on MyChart before the doctors have had a chance to review them. We understand that in some cases there may be results that are confusing or concerning to you. Please understand that not all results are received at the same time and often the doctors may need to interpret multiple results in order to provide you with the best plan of care or course  of treatment. Therefore, we ask that you please give [place] 2 business days to thoroughly review all your results before contacting the office for clarification. Should we see a critical lab result, you will be contacted sooner.   If You Need Anything After Your Visit  If you have any questions or concerns for your doctor, please call our main line at [phone] and press option 4 to reach your doctor's medical assistant. If no one answers, please leave a voicemail as directed and we will return your call as soon as possible. Messages left after 4 pm will be answered the following business day.   You may also send [place] a message via MyChart. We typically respond to MyChart messages within 1-2 business days.  For prescription refills, please ask your pharmacy to contact our office. Our fax number is [phone].  If you have an urgent issue when the clinic is closed that cannot wait until the next business day, you can page your doctor at the number below.    Please note that while we do our best to be available for urgent issues outside of office hours, we are not available 24/7.   If you have an urgent issue and are unable to reach [place], you may choose to seek medical care at your doctor's office, retail clinic, urgent  care center, or emergency room.  If you have a medical emergency, please immediately call 911 or go to the emergency department.  Pager Numbers  - Dr. Gwen Pounds: [phone]  - Dr. Roseanne Reno: [phone]  - Dr. Katrinka Blazing: [phone]   In the event of inclement weather, please call our main line at [phone] for an update on the status of any delays or closures.  Dermatology Medication Tips: Please keep the boxes that topical medications come in in order to help keep track of the instructions about where and how to use these. Pharmacies typically print the medication instructions only on the boxes and not directly on the medication tubes.   If your medication is too expensive, please contact our office at [phone] option 4 or send [place] a message through MyChart.   We are unable to tell what your co-pay for medications will be in advance as this is different depending on your insurance coverage. However, we may be able to find a substitute medication at lower cost or fill out paperwork to get insurance to cover a needed medication.   If a prior authorization is required to get your medication covered by your insurance company, please allow [place] 1-2 business days to complete this process.  Drug prices often vary depending on where the prescription is filled and some pharmacies may offer cheaper prices.  The website www.goodrx.com contains coupons for medications through different pharmacies. The prices here do not account for what the cost may be with help from insurance (it may be cheaper with your insurance), but the website can give you the price if you did not use any insurance.  - You can print the associated coupon and take it with your prescription to the pharmacy.  - You may also stop by our office during regular business hours and pick up a GoodRx coupon card.  - If you need your prescription sent electronically to a different pharmacy, notify our office through [facility]  or by phone at [phone] option 4.     Si Usted Necesita Algo Despus de Su Visita  Tambin puede enviarnos un mensaje a travs de Clinical cytogeneticist. Por lo general respondemos a  los mensajes de MyChart en el transcurso de 1 a 2 das hbiles.  Para renovar recetas, por favor pida a su farmacia que se ponga en contacto con nuestra oficina. Annie Sable de fax es [place] [phone].  Si tiene un asunto urgente cuando la clnica est cerrada y que no puede esperar hasta el siguiente da hbil, puede llamar/localizar a su doctor(a) al nmero que aparece a continuacin.   Por favor, tenga en cuenta que aunque hacemos todo lo posible para estar disponibles para asuntos urgentes fuera del horario de [place], no estamos disponibles las 24 horas del da, los 7 [address]  [address] de la [place].   Si tiene un problema urgente y no puede comunicarse con nosotros, puede optar por buscar atencin mdica  en el consultorio de su doctor(a), en una clnica privada, en un centro de atencin urgente o en una sala de emergencias.  Si tiene Engineer, drilling, por favor llame inmediatamente al 911 o vaya a la sala de emergencias.  Nmeros de bper  - Dr. Gwen Pounds: [phone]  - Dra. Roseanne Reno: [phone]  - Dr. Katrinka Blazing: [phone]   En caso de inclemencias del tiempo, por favor llame a Lacy Duverney principal al [phone] para una actualizacin sobre el [place] de cualquier retraso o cierre.  Consejos para la medicacin en dermatologa: Por favor, guarde las cajas en las que vienen los medicamentos de uso tpico para ayudarle a seguir las instrucciones sobre dnde y cmo usarlos. Las farmacias generalmente imprimen las instrucciones del medicamento slo en las cajas y no directamente en los tubos del [place].   Si su medicamento es muy caro, por favor, pngase en contacto con Rolm Gala llamando al [phone] y presione la opcin 4 o envenos un mensaje a travs de Clinical cytogeneticist.   No podemos decirle cul ser su  copago por los medicamentos por adelantado ya que esto es diferente dependiendo de la cobertura de su seguro. Sin embargo, es posible que podamos encontrar un medicamento sustituto a Audiological scientist un formulario para que el seguro cubra el medicamento que se considera necesario.   Si se requiere una autorizacin previa para que su compaa de seguros [place] su medicamento, por favor permtanos de 1 a 2 das hbiles para completar [address]  Los precios de los medicamentos varan con frecuencia dependiendo del Environmental consultant de dnde se surte la receta y alguna farmacias pueden ofrecer precios ms baratos.  El sitio web www.goodrx.com tiene cupones para medicamentos de Health and safety inspector. Los precios aqu no tienen en cuenta lo que podra costar con la ayuda del seguro (puede ser ms barato con su seguro), pero el sitio web puede darle el precio si no utiliz Tourist information centre manager.  - Puede imprimir el cupn correspondiente y llevarlo con su receta a la farmacia.  - Tambin puede pasar por nuestra oficina durante el horario de atencin regular y Education officer, museum una tarjeta de cupones de GoodRx.  - Si necesita que su receta se enve electrnicamente a una farmacia diferente, informe a nuestra oficina a travs de MyChart de [place] o por telfono llamando al [phone] y presione la opcin 4.

## 2023-09-16 NOTE — Progress Notes (Unsigned)
 Follow-Up Visit   Subjective  Renee Vazquez is a 83 y.o. female who presents for the following: Skin Cancer Screening and Full Body Skin Exam. No personal hx of skin cancer or dysplastic nevi. Hx of actinic keratoses.   Check lesion at right upper arm. Patient reports picking at area at times. Check lesion at inner left upper arm. Getting larger, bothersome. Spot on cheek to check- may be growing.  The patient presents for Total-Body Skin Exam (TBSE) for skin cancer screening and mole check. The patient has spots, moles and lesions to be evaluated, some may be new or changing and the patient may have concern these could be cancer.    The following portions of the chart were reviewed this encounter and updated as appropriate: medications, allergies, medical history  Review of Systems:  No other skin or systemic complaints except as noted in HPI or Assessment and Plan.  Objective  Well appearing patient in no apparent distress; mood and affect are within normal limits.  A full examination was performed including scalp, head, eyes, ears, nose, lips, neck, chest, axillae, abdomen, back, buttocks, bilateral upper extremities, bilateral lower extremities, hands, feet, fingers, toes, fingernails, and toenails. All findings within normal limits unless otherwise noted below.   Relevant physical exam findings are noted in the Assessment and Plan.  Right Upper Elbow - Posterior x1, left axilla x1, L oral commissure x1 (3) Erythematous keratotic or waxy stuck-on papule or plaque. Right Mid Cheek 5 mm pink flesh papule   Assessment & Plan   SKIN CANCER SCREENING PERFORMED TODAY.  ACTINIC DAMAGE - Chronic condition, secondary to cumulative UV/sun exposure - diffuse scaly erythematous macules with underlying dyspigmentation - Recommend daily broad spectrum sunscreen SPF 30+ to sun-exposed areas, reapply every 2 hours as needed.  - Staying in the shade or wearing long sleeves, sun  glasses (UVA+UVB protection) and wide brim hats (4-inch brim around the entire circumference of the hat) are also recommended for sun protection.  - Call for new or changing lesions.  LENTIGINES, SEBORRHEIC KERATOSES, HEMANGIOMAS. Torso, legs - Benign normal skin lesions - Benign-appearing - Call for any changes  MELANOCYTIC NEVI - Tan-brown and/or pink-flesh-colored symmetric macules and papules - Benign appearing on exam today - Observation - Call clinic for new or changing moles - Recommend daily use of broad spectrum spf 30+ sunscreen to sun-exposed areas.    Xerosis with pruritus - diffuse xerotic patches at back - recommend gentle, hydrating skin care - gentle skin care handout given    SKIN CANCER SCREENING   INFLAMED SEBORRHEIC KERATOSIS (3) Right Upper Elbow - Posterior x1, left axilla x1, L oral commissure x1 (3) Symptomatic, irritating, patient would like treated. Destruction of lesion - Right Upper Elbow - Posterior x1, left axilla x1, L oral commissure x1 (3)  Destruction method: cryotherapy   Informed consent: discussed and consent obtained   Lesion destroyed using liquid nitrogen: Yes   Region frozen until ice ball extended beyond lesion: Yes   Outcome: patient tolerated procedure well with no complications   Post-procedure details: wound care instructions given   Additional details:  Prior to procedure, discussed risks of blister formation, small wound, skin dyspigmentation, or rare scar following cryotherapy. Recommend Vaseline ointment to treated areas while healing.  NEOPLASM OF SKIN Right Mid Cheek Epidermal / dermal shaving  Lesion diameter (cm):  0.5 Informed consent: discussed and consent obtained   Patient was prepped and draped in usual sterile fashion: Area prepped with alcohol. Anesthesia:  the lesion was anesthetized in a standard fashion   Anesthetic:  1% lidocaine w/ epinephrine 1-100,000 buffered w/ 8.4% NaHCO3 Instrument used: flexible  razor blade   Hemostasis achieved with: pressure, aluminum chloride and electrodesiccation   Outcome: patient tolerated procedure well   Post-procedure details: wound care instructions given   Post-procedure details comment:  Ointment and small bandage applied Specimen 1 - Surgical pathology Differential Diagnosis: Irritated nevus, R/O BCC  Check Margins: No ACTINIC SKIN DAMAGE   LENTIGO   SEBORRHEIC KERATOSIS   HEMANGIOMA OF SKIN   NEVUS   XEROSIS CUTIS   Return in about 1 year (around 09/15/2024) for TBSE.  I, Lawson Radar, CMA, am acting as scribe for Willeen Niece, MD.   Documentation: I have reviewed the above documentation for accuracy and completeness, and I agree with the above.  Willeen Niece, MD

## 2023-09-17 LAB — SURGICAL PATHOLOGY

## 2023-09-18 ENCOUNTER — Telehealth: Payer: Self-pay

## 2023-09-18 NOTE — Telephone Encounter (Signed)
 Patient informed of pathology results

## 2023-09-18 NOTE — Telephone Encounter (Signed)
-----   Message from Willeen Niece sent at 09/18/2023  1:32 PM EDT ----- 1. Skin, right mid cheek :       NEUROFIBROMA   Benign growth of nerve insulation tissue  - please call patient

## 2023-11-13 ENCOUNTER — Encounter: Payer: Medicare PPO | Admitting: Internal Medicine

## 2023-12-02 ENCOUNTER — Other Ambulatory Visit (INDEPENDENT_AMBULATORY_CARE_PROVIDER_SITE_OTHER): Payer: Medicare PPO

## 2023-12-02 DIAGNOSIS — E7800 Pure hypercholesterolemia, unspecified: Secondary | ICD-10-CM | POA: Diagnosis not present

## 2023-12-02 LAB — CBC WITH DIFFERENTIAL/PLATELET
Basophils Absolute: 0.1 10*3/uL (ref 0.0–0.1)
Basophils Relative: 1.4 % (ref 0.0–3.0)
Eosinophils Absolute: 0.2 10*3/uL (ref 0.0–0.7)
Eosinophils Relative: 3.4 % (ref 0.0–5.0)
HCT: 39.8 % (ref 36.0–46.0)
Hemoglobin: 13.2 g/dL (ref 12.0–15.0)
Lymphocytes Relative: 34.4 % (ref 12.0–46.0)
Lymphs Abs: 1.8 10*3/uL (ref 0.7–4.0)
MCHC: 33.3 g/dL (ref 30.0–36.0)
MCV: 92.9 fl (ref 78.0–100.0)
Monocytes Absolute: 0.6 10*3/uL (ref 0.1–1.0)
Monocytes Relative: 11.4 % (ref 3.0–12.0)
Neutro Abs: 2.7 10*3/uL (ref 1.4–7.7)
Neutrophils Relative %: 49.4 % (ref 43.0–77.0)
Platelets: 263 10*3/uL (ref 150.0–400.0)
RBC: 4.28 Mil/uL (ref 3.87–5.11)
RDW: 13.8 % (ref 11.5–15.5)
WBC: 5.4 10*3/uL (ref 4.0–10.5)

## 2023-12-02 LAB — HEPATIC FUNCTION PANEL
ALT: 11 U/L (ref 0–35)
AST: 19 U/L (ref 0–37)
Albumin: 3.7 g/dL (ref 3.5–5.2)
Alkaline Phosphatase: 52 U/L (ref 39–117)
Bilirubin, Direct: 0.2 mg/dL (ref 0.0–0.3)
Total Bilirubin: 0.6 mg/dL (ref 0.2–1.2)
Total Protein: 7.9 g/dL (ref 6.0–8.3)

## 2023-12-02 LAB — LIPID PANEL
Cholesterol: 201 mg/dL — ABNORMAL HIGH (ref 0–200)
HDL: 61.7 mg/dL (ref >39.00)
LDL Cholesterol: 117 mg/dL — ABNORMAL HIGH (ref 0–99)
NonHDL: 139.49
Total CHOL/HDL Ratio: 3
Triglycerides: 113 mg/dL (ref 0.0–149.0)
VLDL: 22.6 mg/dL (ref 0.0–40.0)

## 2023-12-02 LAB — BASIC METABOLIC PANEL WITH GFR
BUN: 17 mg/dL (ref 6–23)
CO2: 29 meq/L (ref 19–32)
Calcium: 9.8 mg/dL (ref 8.4–10.5)
Chloride: 105 meq/L (ref 96–112)
Creatinine, Ser: 0.94 mg/dL (ref 0.40–1.20)
GFR: 56.64 mL/min — ABNORMAL LOW (ref >60.00)
Glucose, Bld: 93 mg/dL (ref 70–99)
Potassium: 4.4 meq/L (ref 3.5–5.1)
Sodium: 139 meq/L (ref 135–145)

## 2023-12-03 ENCOUNTER — Ambulatory Visit: Payer: Self-pay | Admitting: Internal Medicine

## 2023-12-04 ENCOUNTER — Ambulatory Visit: Payer: Medicare PPO | Admitting: Internal Medicine

## 2023-12-04 ENCOUNTER — Encounter: Payer: Self-pay | Admitting: Internal Medicine

## 2023-12-04 ENCOUNTER — Ambulatory Visit: Admitting: Internal Medicine

## 2023-12-04 VITALS — BP 116/68 | HR 88 | Temp 98.0°F | Resp 16 | Ht 62.0 in | Wt 126.6 lb

## 2023-12-04 DIAGNOSIS — E7800 Pure hypercholesterolemia, unspecified: Secondary | ICD-10-CM | POA: Diagnosis not present

## 2023-12-04 DIAGNOSIS — R944 Abnormal results of kidney function studies: Secondary | ICD-10-CM | POA: Diagnosis not present

## 2023-12-04 DIAGNOSIS — Z0000 Encounter for general adult medical examination without abnormal findings: Secondary | ICD-10-CM

## 2023-12-04 NOTE — Progress Notes (Addendum)
 Subjective:    Patient ID: Renee Vazquez, female    DOB: 1941/03/19, 83 y.o.   MRN: [id number]  Patient here for  Chief Complaint  Patient presents with   Medical Management of Chronic Issues    HPI Here for a scheduled follow up - follow up regarding hypercholesterolemia. Previously evaluated (02/2023) in ER with right upper quadrant pain. She had a RUQ ultrasound done that showed gallstones and thickened gallbladder wall but no pericholecystic fluid. She felt better several hours later and was subsequently discharged home. Pain improved. Saw surgery. Elected watchful observation. S/p cataract surgery 07/2023.   Thickened endometrium colonoscopy   Past Medical History:  Diagnosis Date   Breast cancer [facility]) 2007   Dr Suellen Emms   Hypercholesterolemia    Osteopenia    Personal history of chemotherapy    Personal history of radiation therapy    Past Surgical History:  Procedure Laterality Date   BREAST LUMPECTOMY  2007   CATARACT EXTRACTION W/PHACO Right 08/05/2023   Procedure: CATARACT EXTRACTION PHACO AND INTRAOCULAR LENS PLACEMENT (IOC) RIGHT 10.27 00:59.0;  Surgeon: Clair Crews, MD;  Location: [facility];  Service: Ophthalmology;  Laterality: Right;   CATARACT EXTRACTION W/PHACO Left 08/19/2023   Procedure: CATARACT EXTRACTION PHACO AND INTRAOCULAR LENS PLACEMENT (IOC) LEFT 9.39 00:45.1;  Surgeon: Clair Crews, MD;  Location[facility];  Service: Ophthalmology;  Laterality: Left;   EYE SURGERY     detached retina   Family History  Problem Relation Age of Onset   Hypertension Mother    Hypercholesterolemia Mother    Prostate cancer Father    Cancer Father    Cancer Sister    Melanoma Sister    Vesicoureteral reflux Daughter    Colon cancer Neg Hx    Breast cancer Neg Hx    Social History   Socioeconomic History   Marital status: Divorced    Spouse name: Not on file   Number of children: 4   Years of education: Not on  file   Highest education level: Some college, no degree  Occupational History   Occupation: retired  Tobacco Use   Smoking status: Never   Smokeless tobacco: Never  Vaping Use   Vaping status: Never Used  Substance and Sexual Activity   Alcohol use: Not Currently    Comment: occasional   Drug use: No   Sexual activity: Not Currently    Birth control/protection: Post-menopausal  Other Topics Concern   Not on file  Social History Narrative   Not on file   Social Drivers of Health   Financial Resource Strain: Low Risk  (12/03/2023)   Overall Financial Resource Strain (CARDIA)    Difficulty of Paying Living Expenses: Not hard at all  Food Insecurity: No Food Insecurity (12/03/2023)   Hunger Vital Sign    Worried About Running Out of Food in the Last Year: Never true    Ran Out of Food in the Last Year: Never true  Transportation Needs: No Transportation Needs (12/03/2023)   PRAPARE - Administrator, Civil Service (Medical): No    Lack of Transportation (Non-Medical): No  Physical Activity: Sufficiently Active (12/03/2023)   Exercise Vital Sign    Days of Exercise per Week: 3 days    Minutes of Exercise per Session: 50 min  Stress: No Stress Concern Present (12/03/2023)   [employer] of Occupational Health - Occupational Stress Questionnaire    Feeling of Stress: Not at all  Social Connections:  Moderately Integrated (12/03/2023)   Social Connection and Isolation Panel    Frequency of Communication with Friends and Family: More than three times a week    Frequency of Social Gatherings with Friends and Family: More than three times a week    Attends Religious Services: More than 4 times per year    Active Member of [employer] or Organizations: Yes    Attends Engineer, structural: More than 4 times per year    Marital Status: Divorced     Review of Systems     Objective:     BP 116/68   Pulse 88   Temp 98 F (36.7 C)   Resp 16   Ht 5' 2 (1.575 m)    Wt 126 lb 9.6 oz (57.4 kg)   SpO2 98%   BMI 23.16 kg/m  Wt Readings from Last 3 Encounters:  12/04/23 126 lb 9.6 oz (57.4 kg)  08/19/23 133 lb (60.3 kg)  08/05/23 130 lb 9.6 oz (59.2 kg)    Physical Exam      Outpatient Encounter Medications as of 12/04/2023  Medication Sig   calcium citrate-vitamin D  200-200 MG-UNIT TABS Take 1 tablet by mouth daily.   cetirizine (ZYRTEC) 10 MG tablet Take 10 mg by mouth daily.   MELATONIN PO Take by mouth at bedtime as needed (sleep).   Multiple Vitamins-Minerals (CENTRUM SILVER ADULT 50+ PO) Take by mouth.   Multiple Vitamins-Minerals (PRESERVISION AREDS 2) CAPS Take 2 capsules by mouth daily.   No facility-administered encounter medications on file as of 12/04/2023.     Lab Results  Component Value Date   WBC 5.4 12/02/2023   HGB 13.2 12/02/2023   HCT 39.8 12/02/2023   PLT 263.0 12/02/2023   GLUCOSE 93 12/02/2023   CHOL 201 (H) 12/02/2023   TRIG 113.0 12/02/2023   HDL 61.70 12/02/2023   LDLDIRECT 152.4 08/16/2013   LDLCALC 117 (H) 12/02/2023   ALT 11 12/02/2023   AST 19 12/02/2023   NA 139 12/02/2023   K 4.4 12/02/2023   CL 105 12/02/2023   CREATININE 0.94 12/02/2023   BUN 17 12/02/2023   CO2 29 12/02/2023   TSH 3.81 02/10/2023    No results found.     Assessment & Plan:  Hypercholesterolemia -     Basic metabolic panel with GFR; Future -     Hepatic function panel; Future -     Lipid panel; Future  Health care maintenance -     TSH; Future      Dellar Fenton, MD

## 2023-12-04 NOTE — Progress Notes (Deleted)
 Subjective:    Patient ID: Renee Vazquez, female    DOB: Apr 23, 1941, 83 y.o.   MRN: [id number]  Patient here for No chief complaint on file.   HPI Here for a scheduled follow up - follow up regarding hypercholesterolemia. Previously evaluated (02/2023) in ER with right upper quadrant pain. She had a RUQ ultrasound done that showed gallstones and thickened gallbladder wall but no pericholecystic fluid. She felt better several hours later and was subsequently discharged home. Pain improved. Saw surgery. Elected watchful observation. S/p cataract surgery 07/2023.   Thickened endometrium colonoscopy   Past Medical History:  Diagnosis Date   Breast cancer [facility]) 2007   Dr Suellen Emms   Hypercholesterolemia    Osteopenia    Personal history of chemotherapy    Personal history of radiation therapy    Past Surgical History:  Procedure Laterality Date   BREAST LUMPECTOMY  2007   CATARACT EXTRACTION W/PHACO Right 08/05/2023   Procedure: CATARACT EXTRACTION PHACO AND INTRAOCULAR LENS PLACEMENT (IOC) RIGHT 10.27 00:59.0;  Surgeon: Clair Crews, MD;  Location: [facility];  Service: Ophthalmology;  Laterality: Right;   CATARACT EXTRACTION W/PHACO Left 08/19/2023   Procedure: CATARACT EXTRACTION PHACO AND INTRAOCULAR LENS PLACEMENT (IOC) LEFT 9.39 00:45.1;  Surgeon: Clair Crews, MD;  Location: [facility];  Service: Ophthalmology;  Laterality: Left;   EYE SURGERY     detached retina   Family History  Problem Relation Age of Onset   Hypertension Mother    Hypercholesterolemia Mother    Prostate cancer Father    Cancer Father    Cancer Sister    Melanoma Sister    Vesicoureteral reflux Daughter    Colon cancer Neg Hx    Breast cancer Neg Hx    Social History   Socioeconomic History   Marital status: Divorced    Spouse name: Not on file   Number of children: 4   Years of education: Not on file   Highest education level: Some college, no degree   Occupational History   Occupation: retired  Tobacco Use   Smoking status: Never   Smokeless tobacco: Never  Vaping Use   Vaping status: Never Used  Substance and Sexual Activity   Alcohol use: Not Currently    Comment: occasional   Drug use: No   Sexual activity: Not Currently    Birth control/protection: Post-menopausal  Other Topics Concern   Not on file  Social History Narrative   Not on file   Social Drivers of Health   Financial Resource Strain: Low Risk  (12/03/2023)   Overall Financial Resource Strain (CARDIA)    Difficulty of Paying Living Expenses: Not hard at all  Food Insecurity: No Food Insecurity (12/03/2023)   Hunger Vital Sign    Worried About Running Out of Food in the Last Year: Never true    Ran Out of Food in the Last Year: Never true  Transportation Needs: No Transportation Needs (12/03/2023)   PRAPARE - Administrator, Civil Service (Medical): No    Lack of Transportation (Non-Medical): No  Physical Activity: Sufficiently Active (12/03/2023)   Exercise Vital Sign    Days of Exercise per Week: 3 days    Minutes of Exercise per Session: 50 min  Stress: No Stress Concern Present (12/03/2023)   [employer] of Occupational Health - Occupational Stress Questionnaire    Feeling of Stress : Not at all  Social Connections: Moderately Integrated (12/03/2023)   Social Connection and Isolation  Panel    Frequency of Communication with Friends and Family: More than three times a week    Frequency of Social Gatherings with Friends and Family: More than three times a week    Attends Religious Services: More than 4 times per year    Active Member of [employer] or Organizations: Yes    Attends Engineer, structural: More than 4 times per year    Marital Status: Divorced     Review of Systems     Objective:     There were no vitals taken for this visit. Wt Readings from Last 3 Encounters:  08/19/23 133 lb (60.3 kg)  08/05/23 130 lb 9.6 oz  (59.2 kg)  06/05/23 130 lb 6.4 oz (59.1 kg)    Physical Exam  {Perform Simple Foot Exam  Perform Detailed exam:1} {Insert foot Exam (Optional):30965}   Outpatient Encounter Medications as of 12/04/2023  Medication Sig   calcium citrate-vitamin D  200-200 MG-UNIT TABS Take 1 tablet by mouth daily.   cetirizine (ZYRTEC) 10 MG tablet Take 10 mg by mouth daily.   MELATONIN PO Take by mouth at bedtime as needed (sleep).   Multiple Vitamins-Minerals (CENTRUM SILVER ADULT 50+ PO) Take by mouth.   Multiple Vitamins-Minerals (PRESERVISION AREDS 2) CAPS Take 2 capsules by mouth daily.   No facility-administered encounter medications on file as of 12/04/2023.     Lab Results  Component Value Date   WBC 5.4 12/02/2023   HGB 13.2 12/02/2023   HCT 39.8 12/02/2023   PLT 263.0 12/02/2023   GLUCOSE 93 12/02/2023   CHOL 201 (H) 12/02/2023   TRIG 113.0 12/02/2023   HDL 61.70 12/02/2023   LDLDIRECT 152.4 08/16/2013   LDLCALC 117 (H) 12/02/2023   ALT 11 12/02/2023   AST 19 12/02/2023   NA 139 12/02/2023   K 4.4 12/02/2023   CL 105 12/02/2023   CREATININE 0.94 12/02/2023   BUN 17 12/02/2023   CO2 29 12/02/2023   TSH 3.81 02/10/2023    No results found.     Assessment & Plan:  Hypercholesterolemia  Health care maintenance     Dellar Fenton, MD

## 2023-12-04 NOTE — Addendum Note (Signed)
 Addended by: Raejean Bullock on: 12/04/2023 09:57 AM   Modules accepted: Orders

## 2023-12-10 DIAGNOSIS — H353131 Nonexudative age-related macular degeneration, bilateral, early dry stage: Secondary | ICD-10-CM | POA: Diagnosis not present

## 2023-12-10 DIAGNOSIS — H02889 Meibomian gland dysfunction of unspecified eye, unspecified eyelid: Secondary | ICD-10-CM | POA: Diagnosis not present

## 2023-12-10 DIAGNOSIS — M3501 Sicca syndrome with keratoconjunctivitis: Secondary | ICD-10-CM | POA: Diagnosis not present

## 2023-12-31 ENCOUNTER — Other Ambulatory Visit (INDEPENDENT_AMBULATORY_CARE_PROVIDER_SITE_OTHER)

## 2023-12-31 ENCOUNTER — Ambulatory Visit: Payer: Self-pay | Admitting: Internal Medicine

## 2023-12-31 DIAGNOSIS — R944 Abnormal results of kidney function studies: Secondary | ICD-10-CM

## 2023-12-31 LAB — BASIC METABOLIC PANEL WITH GFR
BUN: 20 mg/dL (ref 6–23)
CO2: 28 meq/L (ref 19–32)
Calcium: 9.9 mg/dL (ref 8.4–10.5)
Chloride: 101 meq/L (ref 96–112)
Creatinine, Ser: 1.01 mg/dL (ref 0.40–1.20)
GFR: 51.93 mL/min — ABNORMAL LOW (ref >60.00)
Glucose, Bld: 99 mg/dL (ref 70–99)
Potassium: 4.7 meq/L (ref 3.5–5.1)
Sodium: 137 meq/L (ref 135–145)

## 2024-01-02 NOTE — Telephone Encounter (Signed)
 Copied from CRM [id number]. Topic: Clinical - Lab/Test Results >> Jan 02, 2024 11:24 AM [place] J wrote: Reason for CRM: Lab results were relayed to patient successfully. She will call back Monday to schedule follow-up labs.

## 2024-01-13 ENCOUNTER — Other Ambulatory Visit: Payer: Self-pay | Admitting: Internal Medicine

## 2024-01-13 DIAGNOSIS — Z1231 Encounter for screening mammogram for malignant neoplasm of breast: Secondary | ICD-10-CM

## 2024-02-02 ENCOUNTER — Other Ambulatory Visit (INDEPENDENT_AMBULATORY_CARE_PROVIDER_SITE_OTHER)

## 2024-02-02 DIAGNOSIS — E7800 Pure hypercholesterolemia, unspecified: Secondary | ICD-10-CM | POA: Diagnosis not present

## 2024-02-02 DIAGNOSIS — R944 Abnormal results of kidney function studies: Secondary | ICD-10-CM | POA: Diagnosis not present

## 2024-02-02 LAB — HEPATIC FUNCTION PANEL
ALT: 10 U/L (ref 0–35)
AST: 18 U/L (ref 0–37)
Albumin: 3.7 g/dL (ref 3.5–5.2)
Alkaline Phosphatase: 57 U/L (ref 39–117)
Bilirubin, Direct: 0.1 mg/dL (ref 0.0–0.3)
Total Bilirubin: 0.6 mg/dL (ref 0.2–1.2)
Total Protein: 7.4 g/dL (ref 6.0–8.3)

## 2024-02-02 LAB — BASIC METABOLIC PANEL WITH GFR
BUN: 15 mg/dL (ref 6–23)
CO2: 28 meq/L (ref 19–32)
Calcium: 9.2 mg/dL (ref 8.4–10.5)
Chloride: 102 meq/L (ref 96–112)
Creatinine, Ser: 0.86 mg/dL (ref 0.40–1.20)
GFR: 62.94 mL/min (ref >60.00)
Glucose, Bld: 86 mg/dL (ref 70–99)
Potassium: 4.6 meq/L (ref 3.5–5.1)
Sodium: 138 meq/L (ref 135–145)

## 2024-02-02 LAB — LIPID PANEL
Cholesterol: 200 mg/dL (ref 0–200)
HDL: 62.9 mg/dL (ref >39.00)
LDL Cholesterol: 117 mg/dL — ABNORMAL HIGH (ref 0–99)
NonHDL: 136.62
Total CHOL/HDL Ratio: 3
Triglycerides: 98 mg/dL (ref 0.0–149.0)
VLDL: 19.6 mg/dL (ref 0.0–40.0)

## 2024-02-02 LAB — TSH: TSH: 5.33 u[IU]/mL (ref 0.35–5.50)

## 2024-03-11 DIAGNOSIS — H02889 Meibomian gland dysfunction of unspecified eye, unspecified eyelid: Secondary | ICD-10-CM | POA: Diagnosis not present

## 2024-03-11 DIAGNOSIS — M3501 Sicca syndrome with keratoconjunctivitis: Secondary | ICD-10-CM | POA: Diagnosis not present

## 2024-03-11 DIAGNOSIS — Z961 Presence of intraocular lens: Secondary | ICD-10-CM | POA: Diagnosis not present

## 2024-03-11 DIAGNOSIS — H353131 Nonexudative age-related macular degeneration, bilateral, early dry stage: Secondary | ICD-10-CM | POA: Diagnosis not present

## 2024-06-02 NOTE — Addendum Note (Signed)
 Addended by: GLENDIA ALLENA RAMAN on: 06/02/2024 03:11 PM   Modules accepted: Orders

## 2024-06-02 NOTE — Addendum Note (Signed)
 Addended by: BRIEN SHARENE RAMAN on: 06/02/2024 09:21 AM   Modules accepted: Orders

## 2024-06-07 ENCOUNTER — Ambulatory Visit

## 2024-06-07 ENCOUNTER — Other Ambulatory Visit

## 2024-06-07 DIAGNOSIS — R944 Abnormal results of kidney function studies: Secondary | ICD-10-CM

## 2024-06-07 DIAGNOSIS — E7800 Pure hypercholesterolemia, unspecified: Secondary | ICD-10-CM

## 2024-06-07 LAB — HEPATIC FUNCTION PANEL
ALT: 13 U/L (ref 0–35)
AST: 21 U/L (ref 0–37)
Albumin: 3.8 g/dL (ref 3.5–5.2)
Alkaline Phosphatase: 55 U/L (ref 39–117)
Bilirubin, Direct: 0.1 mg/dL (ref 0.0–0.3)
Total Bilirubin: 0.7 mg/dL (ref 0.2–1.2)
Total Protein: 7.7 g/dL (ref 6.0–8.3)

## 2024-06-07 LAB — BASIC METABOLIC PANEL WITH GFR
BUN: 14 mg/dL (ref 6–23)
CO2: 30 meq/L (ref 19–32)
Calcium: 9.4 mg/dL (ref 8.4–10.5)
Chloride: 101 meq/L (ref 96–112)
Creatinine, Ser: 0.85 mg/dL (ref 0.40–1.20)
GFR: 63.68 mL/min (ref >60.00)
Glucose, Bld: 88 mg/dL (ref 70–99)
Potassium: 4.5 meq/L (ref 3.5–5.1)
Sodium: 137 meq/L (ref 135–145)

## 2024-06-07 LAB — LIPID PANEL
Cholesterol: 192 mg/dL (ref 0–200)
HDL: 60.4 mg/dL (ref >39.00)
LDL Cholesterol: 103 mg/dL — ABNORMAL HIGH (ref 0–99)
NonHDL: 131.12
Total CHOL/HDL Ratio: 3
Triglycerides: 139 mg/dL (ref 0.0–149.0)
VLDL: 27.8 mg/dL (ref 0.0–40.0)

## 2024-06-08 ENCOUNTER — Ambulatory Visit: Payer: Self-pay | Admitting: Internal Medicine

## 2024-06-09 ENCOUNTER — Telehealth: Payer: Self-pay

## 2024-06-09 ENCOUNTER — Ambulatory Visit: Admitting: Internal Medicine

## 2024-06-09 VITALS — BP 128/72 | HR 87 | Temp 97.6°F | Ht 62.0 in | Wt 127.4 lb

## 2024-06-09 DIAGNOSIS — R944 Abnormal results of kidney function studies: Secondary | ICD-10-CM | POA: Diagnosis not present

## 2024-06-09 DIAGNOSIS — E7800 Pure hypercholesterolemia, unspecified: Secondary | ICD-10-CM

## 2024-06-09 DIAGNOSIS — K8020 Calculus of gallbladder without cholecystitis without obstruction: Secondary | ICD-10-CM

## 2024-06-09 DIAGNOSIS — Z0000 Encounter for general adult medical examination without abnormal findings: Secondary | ICD-10-CM | POA: Diagnosis not present

## 2024-06-09 DIAGNOSIS — D649 Anemia, unspecified: Secondary | ICD-10-CM

## 2024-06-09 NOTE — Progress Notes (Signed)
 "  Subjective:    Patient ID: Renee Vazquez, female    DOB: 08-17-1940, 83 y.o.   MRN: [id number]  Patient here for  Chief Complaint  Patient presents with   Medical Management of Chronic Issues   Annual Exam    HPI Here for physical exam.  Previously evaluated (02/2023) in ER with right upper quadrant pain. She had a RUQ ultrasound done that showed gallstones and thickened gallbladder wall but no pericholecystic fluid. She felt better several hours later and was subsequently discharged home. Pain improved. Saw surgery. Elected watchful observation. Appears to be doing well. No abdominal pain reported. Stays active. No chest pain or sob reported. Recent eye surgeries.    Past Medical History:  Diagnosis Date   Breast cancer [facility]) 2007   Dr Rodgers Maryl Sayer   Hypercholesterolemia    Osteopenia    Personal history of chemotherapy    Personal history of radiation therapy    Past Surgical History:  Procedure Laterality Date   BREAST LUMPECTOMY  06/24/2005   CATARACT EXTRACTION W/PHACO Right 08/05/2023   Procedure: CATARACT EXTRACTION PHACO AND INTRAOCULAR LENS PLACEMENT (IOC) RIGHT 10.27 00:59.0;  Surgeon: Jaye Fallow, MD;  Location: [facility];  Service: Ophthalmology;  Laterality: Right;   CATARACT EXTRACTION W/PHACO Left 08/19/2023   Procedure: CATARACT EXTRACTION PHACO AND INTRAOCULAR LENS PLACEMENT (IOC) LEFT 9.39 00:45.1;  Surgeon: Jaye Fallow, MD;  Location: [facility];  Service: Ophthalmology;  Laterality: Left;   EYE SURGERY  1988   detached retina   Family History  Problem Relation Age of Onset   Hypertension Mother    Hypercholesterolemia Mother    Prostate cancer Father    Cancer Father    Cancer Sister    Melanoma Sister    Cancer Sister    Vesicoureteral reflux Daughter    Cancer Sister    Colon cancer Neg Hx    Breast cancer Neg Hx    Social History   Socioeconomic History   Marital status: Divorced    Spouse name:  Not on file   Number of children: 4   Years of education: Not on file   Highest education level: Some college, no degree  Occupational History   Occupation: retired  Tobacco Use   Smoking status: Never   Smokeless tobacco: Never  Vaping Use   Vaping status: Never Used  Substance and Sexual Activity   Alcohol use: Not Currently    Comment: occasional   Drug use: No   Sexual activity: Not Currently    Birth control/protection: Post-menopausal  Other Topics Concern   Not on file  Social History Narrative   Not on file   Social Drivers of Health   Tobacco Use: Low Risk (06/18/2024)   Patient History    Smoking Tobacco Use: Never    Smokeless Tobacco Use: Never    Passive Exposure: Not on file  Financial Resource Strain: Low Risk (06/09/2024)   Overall Financial Resource Strain (CARDIA)    Difficulty of Paying Living Expenses: Not hard at all  Food Insecurity: No Food Insecurity (06/09/2024)   Epic    Worried About Radiation Protection Practitioner of Food in the Last Year: Never true    Ran Out of Food in the Last Year: Never true  Transportation Needs: No Transportation Needs (06/09/2024)   Epic    Lack of Transportation (Medical): No    Lack of Transportation (Non-Medical): No  Physical Activity: Insufficiently Active (06/09/2024)   Exercise Vital Sign  Days of Exercise per Week: 3 days    Minutes of Exercise per Session: 40 min  Stress: No Stress Concern Present (06/09/2024)   [employer] of Occupational Health - Occupational Stress Questionnaire    Feeling of Stress: Not at all  Social Connections: Moderately Integrated (06/09/2024)   Social Connection and Isolation Panel    Frequency of Communication with Friends and Family: More than three times a week    Frequency of Social Gatherings with Friends and Family: More than three times a week    Attends Religious Services: More than 4 times per year    Active Member of Clubs or Organizations: Yes    Attends Banker  Meetings: 1 to 4 times per year    Marital Status: Divorced  Depression (PHQ2-9): Low Risk (12/04/2023)   Depression (PHQ2-9)    PHQ-2 Score: 0  Alcohol Screen: Low Risk (12/03/2023)   Alcohol Screen    Last Alcohol Screening Score (AUDIT): 1  Housing: Unknown (06/09/2024)   Epic    Unable to Pay for Housing in the Last Year: No    Number of Times Moved in the Last Year: Not on file    Homeless in the Last Year: No  Utilities: Not At Risk (11/06/2022)   A[employer] Utilities    Threatened with loss of utilities: No  Health Literacy: Not on file     Review of Systems  Constitutional:  Negative for appetite change and unexpected weight change.  HENT:  Negative for congestion, sinus pressure and sore throat.   Eyes:  Negative for pain and visual disturbance.  Respiratory:  Negative for cough, chest tightness and shortness of breath.   Cardiovascular:  Negative for chest pain, palpitations and leg swelling.  Gastrointestinal:  Negative for abdominal pain, diarrhea, nausea and vomiting.  Genitourinary:  Negative for difficulty urinating and dysuria.  Musculoskeletal:  Negative for joint swelling and myalgias.  Skin:  Negative for color change and rash.  Neurological:  Negative for dizziness and headaches.  Hematological:  Negative for adenopathy. Does not bruise/bleed easily.  Psychiatric/Behavioral:  Negative for agitation and dysphoric mood.        Objective:     BP 128/72   Pulse 87   Temp 97.6 F (36.4 C) (Oral)   Ht 5' 2 (1.575 m)   Wt 127 lb 6.4 oz (57.8 kg)   SpO2 96%   BMI 23.30 kg/m  Wt Readings from Last 3 Encounters:  06/09/24 127 lb 6.4 oz (57.8 kg)  12/04/23 126 lb 9.6 oz (57.4 kg)  08/19/23 133 lb (60.3 kg)    Physical Exam Vitals reviewed.  Constitutional:      General: She is not in acute distress.    Appearance: Normal appearance.  HENT:     Head: Normocephalic and atraumatic.     Right Ear: External ear normal.     Left Ear: External ear normal.      Mouth/Throat:     Pharynx: No oropharyngeal exudate or posterior oropharyngeal erythema.  Eyes:     General: No scleral icterus.       Right eye: No discharge.        Left eye: No discharge.     Conjunctiva/sclera: Conjunctivae normal.  Neck:     Thyroid : No thyromegaly.  Cardiovascular:     Rate and Rhythm: Normal rate and regular rhythm.  Pulmonary:     Effort: No respiratory distress.     Breath sounds: Normal breath sounds. No wheezing.  Comments: Breasts exam - no nipple discharge. Left nipple inverted (chronic). Could not appreciate any distinct nodules or axillary adenopathy.  Abdominal:     General: Bowel sounds are normal.     Palpations: Abdomen is soft.     Tenderness: There is no abdominal tenderness.  Musculoskeletal:        General: No swelling or tenderness.     Cervical back: Neck supple. No tenderness.  Lymphadenopathy:     Cervical: No cervical adenopathy.  Skin:    Findings: No erythema or rash.  Neurological:     Mental Status: She is alert.  Psychiatric:        Mood and Affect: Mood normal.        Behavior: Behavior normal.         Outpatient Encounter Medications as of 06/09/2024  Medication Sig   calcium citrate-vitamin D  200-200 MG-UNIT TABS Take 1 tablet by mouth daily.   cetirizine (ZYRTEC) 10 MG tablet Take 10 mg by mouth daily.   MELATONIN PO Take by mouth at bedtime as needed (sleep).   Multiple Vitamins-Minerals (CENTRUM SILVER ADULT 50+ PO) Take by mouth.   Multiple Vitamins-Minerals (PRESERVISION AREDS 2) CAPS Take 2 capsules by mouth daily.   No facility-administered encounter medications on file as of 06/09/2024.     Lab Results  Component Value Date   WBC 5.4 12/02/2023   HGB 13.2 12/02/2023   HCT 39.8 12/02/2023   PLT 263.0 12/02/2023   GLUCOSE 88 06/07/2024   CHOL 192 06/07/2024   TRIG 139.0 06/07/2024   HDL 60.40 06/07/2024   LDLDIRECT 152.4 08/16/2013   LDLCALC 103 (H) 06/07/2024   ALT 13 06/07/2024   AST 21  06/07/2024   NA 137 06/07/2024   K 4.5 06/07/2024   CL 101 06/07/2024   CREATININE 0.85 06/07/2024   BUN 14 06/07/2024   CO2 30 06/07/2024   TSH 5.33 02/02/2024       Assessment & Plan:  Routine general medical examination at a health care facility  Hypercholesterolemia Assessment & Plan: Have discussed recommendation for cholesterol medication.  She declines.  Wants to continue diet and exercise. Follow lipid panel.   Orders: -     Lipid panel; Future -     Hepatic function panel; Future -     CBC with Differential/Platelet; Future  Decreased GFR Assessment & Plan: Stay hydrated. Avoid antiinflammatory medication. Follow metabolic panel.   Orders: -     Basic metabolic panel with GFR; Future  Anemia, unspecified type Assessment & Plan: Follow cbc.    Gallstones Assessment & Plan: She was recently evaluated (02/2023) in ER with right upper quadrant pain. She had a RUQ ultrasound done that showed gallstones and thickened gallbladder wall but no pericholecystic fluid. She felt better several hours later and was subsequently discharged home. Pain improved. Saw surgery. Elected watchful observation. She has had no further episodes of abdominal pain. Eating well.  No nausea or vomiting. Elects to continue to monitor.    Health care maintenance Assessment & Plan: Physical today 06/09/24.  Mammogram 07/08/23 - [facility] [place] [facility]).  Colonoscopy 08/2013.  Recommended f/u in 10 years.  Has declined hemoccult cards. States was told by GI did not need further colonoscopy.       Allena Hamilton, MD "

## 2024-06-09 NOTE — Assessment & Plan Note (Signed)
 Have discussed recommendation for cholesterol medication.  She declines.  Wants to continue diet and exercise. Follow lipid panel.

## 2024-06-09 NOTE — Telephone Encounter (Signed)
 Renee Vazquez, CMA, came to let us  know that patient's appointment today will be for a physical instead of an office visit.  Patient had already been checked in, so I made a note in her appointment note for the visit.  I spoke with patient and let her know that regarding her visit today, since she paid online, we are unable to refund her copay here today.  I let her know that as long as her visit only included services related to a physical, she should be receiving a refund for her copay.

## 2024-06-18 ENCOUNTER — Encounter: Payer: Self-pay | Admitting: Internal Medicine

## 2024-06-18 NOTE — Assessment & Plan Note (Signed)
 She was recently evaluated (02/2023) in ER with right upper quadrant pain. She had a RUQ ultrasound done that showed gallstones and thickened gallbladder wall but no pericholecystic fluid. She felt better several hours later and was subsequently discharged home. Pain improved. Saw surgery. Elected watchful observation. She has had no further episodes of abdominal pain. Eating well.  No nausea or vomiting. Elects to continue to monitor.

## 2024-06-18 NOTE — Assessment & Plan Note (Signed)
 Physical today 06/09/24.  Mammogram 07/08/23 - Birads I [facility]).  Colonoscopy 08/2013.  Recommended f/u in 10 years.  Has declined hemoccult cards. States was told by GI did not need further colonoscopy.

## 2024-06-18 NOTE — Assessment & Plan Note (Signed)
Stay hydrated.  Avoid antiinflammatory medication.  Follow metabolic panel.

## 2024-06-18 NOTE — Assessment & Plan Note (Signed)
 Follow cbc.

## 2024-07-08 ENCOUNTER — Ambulatory Visit
Admission: RE | Admit: 2024-07-08 | Discharge: 2024-07-08 | Disposition: A | Discharge status: Home / Self Care | Source: Ambulatory Visit | Attending: Internal Medicine | Admitting: Internal Medicine

## 2024-07-08 DIAGNOSIS — Z1231 Encounter for screening mammogram for malignant neoplasm of breast: Secondary | ICD-10-CM | POA: Insufficient documentation

## 2024-08-17 ENCOUNTER — Ambulatory Visit

## 2024-09-14 ENCOUNTER — Ambulatory Visit: Admitting: Dermatology

## 2024-09-20 ENCOUNTER — Encounter: Admitting: Dermatology

## 2024-12-07 ENCOUNTER — Other Ambulatory Visit

## 2024-12-09 ENCOUNTER — Ambulatory Visit: Admitting: Internal Medicine
